# Patient Record
Sex: Female | Born: 1975 | Race: Black or African American | Hispanic: No | Marital: Married | Smoking: Current every day smoker
Health system: Southern US, Community
[De-identification: ages and names within clinical notes are randomized; demographics above are authoritative.]

## PROBLEM LIST (undated history)

## (undated) DIAGNOSIS — I219 Acute myocardial infarction, unspecified: Secondary | ICD-10-CM

## (undated) DIAGNOSIS — J3800 Paralysis of vocal cords and larynx, unspecified: Secondary | ICD-10-CM

## (undated) DIAGNOSIS — J45909 Unspecified asthma, uncomplicated: Secondary | ICD-10-CM

## (undated) DIAGNOSIS — K311 Adult hypertrophic pyloric stenosis: Secondary | ICD-10-CM

## (undated) DIAGNOSIS — I2510 Atherosclerotic heart disease of native coronary artery without angina pectoris: Secondary | ICD-10-CM

## (undated) DIAGNOSIS — I10 Essential (primary) hypertension: Secondary | ICD-10-CM

## (undated) HISTORY — PX: OTHER SURGICAL HISTORY: SHX169

## (undated) HISTORY — PX: APPENDECTOMY: SHX54

## (undated) HISTORY — PX: CHOLECYSTECTOMY: SHX55

## (undated) HISTORY — PX: MEDIAL PARTIAL KNEE REPLACEMENT: SHX5965

---
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
## 2016-05-03 ENCOUNTER — Emergency Department (HOSPITAL_COMMUNITY)
Admission: EM | Admit: 2016-05-03 | Discharge: 2016-05-04 | Disposition: A | Discharge status: Home / Self Care | Payer: Medicaid Other | Attending: Emergency Medicine | Admitting: Emergency Medicine

## 2016-05-03 ENCOUNTER — Encounter (HOSPITAL_COMMUNITY): Payer: Self-pay | Admitting: Emergency Medicine

## 2016-05-03 DIAGNOSIS — R51 Headache: Secondary | ICD-10-CM | POA: Insufficient documentation

## 2016-05-03 DIAGNOSIS — I10 Essential (primary) hypertension: Secondary | ICD-10-CM | POA: Insufficient documentation

## 2016-05-03 DIAGNOSIS — I252 Old myocardial infarction: Secondary | ICD-10-CM | POA: Insufficient documentation

## 2016-05-03 DIAGNOSIS — R519 Headache, unspecified: Secondary | ICD-10-CM

## 2016-05-03 HISTORY — DX: Essential (primary) hypertension: I10

## 2016-05-03 HISTORY — DX: Acute myocardial infarction, unspecified: I21.9

## 2016-05-03 MED ORDER — SODIUM CHLORIDE 0.9 % IV BOLUS (SEPSIS)
1000.0000 mL | Freq: Once | INTRAVENOUS | Status: AC
  Administered 2016-05-04: 1000 mL via INTRAVENOUS

## 2016-05-03 MED ORDER — KETOROLAC TROMETHAMINE 15 MG/ML IJ SOLN
15.0000 mg | Freq: Once | INTRAMUSCULAR | Status: AC
  Administered 2016-05-04: 15 mg via INTRAVENOUS
  Filled 2016-05-03: qty 1

## 2016-05-03 MED ORDER — PROCHLORPERAZINE EDISYLATE 5 MG/ML IJ SOLN
10.0000 mg | Freq: Once | INTRAMUSCULAR | Status: AC
Start: 2016-05-04 — End: 2016-05-04
  Administered 2016-05-04: 10 mg via INTRAVENOUS
  Filled 2016-05-03: qty 2

## 2016-05-03 MED ORDER — DIPHENHYDRAMINE HCL 50 MG/ML IJ SOLN
25.0000 mg | Freq: Once | INTRAMUSCULAR | Status: AC
  Administered 2016-05-04: 25 mg via INTRAVENOUS
  Filled 2016-05-03: qty 1

## 2016-05-03 NOTE — ED Triage Notes (Addendum)
Pt reports headache with left eye pain since 1000 today. Pt reports N/V, but denies diarrhea, light sensitivity, and abdominal pain.  Hx migraines.

## 2016-05-03 NOTE — ED Provider Notes (Signed)
[facility]-EMERGENCY DEPT Provider Note   CSN: [id number] Arrival date & time: 05/03/16  2004  By signing my name below, I, Julia Ingram, attest that this documentation has been prepared under the direction and in the presence of Raeford Razor, MD. Electronically Signed: Rosario Ingram, ED Scribe. 05/03/16. 11:47 PM.  History   Chief Complaint Chief Complaint  Patient presents with  . Migraine   The history is provided by the patient. No language interpreter was used.   HPI Comments: Julia Ingram is a 40 y.o. female with a PMHx of HTN and MI, who presents to the Emergency Department complaining of diffuse HA. She describes her pain as pressure-like and squeezing. She states that she woke up with her HA this morning, and it has gradually worsened throughout the day. Pt reports associated nausea and vomiting secondary to her HA. Pt took an Excedrin with minimal relief of her HA prior to coming into the ED. Pt has a hx of Migraines, but notes that her pain today feels different than her previous HAs. She notes that she has a hx of HTN as well and reports that upon checking her BP it was elevated. Denies fever, visual changes, neck pain, diarrhea, photophobia, numbness, tingling, abdominal pain, or any other associated symptoms.   Past Medical History:  Diagnosis Date  . Hypertension   . MI (myocardial infarction) (HCC)    There are no active problems to display for this patient.  Past Surgical History:  Procedure Laterality Date  . APPENDECTOMY    . CHOLECYSTECTOMY     OB History    No data available     Home Medications    Prior to Admission medications   Not on File   Family History No family history on file.  Social History Social History  Substance Use Topics  . Smoking status: Not on file  . Smokeless tobacco: Not on file  . Alcohol use Not on file   Allergies   Magnesium sulfate; Contrast media [iodinated diagnostic agents]; and Demerol  [meperidine]  Review of Systems Review of Systems  Constitutional: Negative for fever.  Eyes: Negative for photophobia and visual disturbance.  Gastrointestinal: Negative for abdominal pain and diarrhea.  Musculoskeletal: Negative for neck pain.  Neurological: Positive for headaches. Negative for numbness.  All other systems reviewed and are negative.  Physical Exam Updated Vital Signs BP (!) 165/104 (BP Location: Left Arm)   Pulse 78   Temp 98.7 F (37.1 C) (Oral)   Resp 20   Ht 5\' 9"  (1.753 m)   SpO2 99%   Physical Exam  Constitutional: She is oriented to person, place, and time. She appears well-developed and well-nourished. No distress.  HENT:  Head: Normocephalic and atraumatic.  Mouth/Throat: Oropharynx is clear and moist. No oropharyngeal exudate.  No nuchal rigidity.   Eyes: Conjunctivae and EOM are normal. Pupils are equal, round, and reactive to light. Right eye exhibits no discharge. Left eye exhibits no discharge. No scleral icterus.  Neck: Normal range of motion. Neck supple. No JVD present. No thyromegaly present.  Cardiovascular: Normal rate, regular rhythm, normal heart sounds and intact distal pulses.  Exam reveals no gallop and no friction rub.   No murmur heard. Pulmonary/Chest: Effort normal and breath sounds normal. No respiratory distress. She has no wheezes. She has no rales.  Abdominal: Soft. Bowel sounds are normal. She exhibits no distension and no mass. There is no tenderness.  Musculoskeletal: Normal range of motion. She exhibits no edema or  tenderness.  Lymphadenopathy:    She has no cervical adenopathy.  Neurological: She is alert and oriented to person, place, and time. She has normal reflexes. She displays normal reflexes. No cranial nerve deficit. She exhibits normal muscle tone. Coordination normal.  Skin: Skin is warm and dry. No rash noted. No erythema.  Psychiatric: She has a normal mood and affect. Her behavior is normal.  Nursing note and  vitals reviewed.  ED Treatments / Results  DIAGNOSTIC STUDIES: Oxygen Saturation is 99% on RA, normal by my interpretation.   COORDINATION OF CARE: 11:40 PM-Discussed next steps with pt. Pt verbalized understanding and is agreeable with the plan.   Labs (all labs ordered are listed, but only abnormal results are displayed) Labs Reviewed - No data to display  EKG  EKG Interpretation None       Radiology No results found.  Procedures Procedures (including critical care time)  Medications Ordered in ED Medications - No data to display  Initial Impression / Assessment and Plan / ED Course  I have reviewed the triage vital signs and the nursing notes.  Pertinent labs & imaging results that were available during my care of the patient were reviewed by me and considered in my medical decision making (see chart for details).  Clinical Course   40yF with headache. Suspect primary HA. Consider emergent secondary causes such as bleed, infectious or mass but doubt. There is no history of trauma. Pt has a nonfocal neurological exam. Afebrile and neck supple. No use of blood thinning medication. Consider ocular etiology such as acute angle closure glaucoma but doubt. Pt denies acute change in visual acuity and eye exam unremarkable. Doubt temporal arteritis given age, no temporal tenderness and temporal artery pulsations palpable. Doubt CO poisoning. No contacts with similar symptoms. Doubt venous thrombosis. Doubt carotid or vertebral arteries dissection. Symptoms improved with meds. Feel that can be safely discharged, but strict return precautions discussed. Outpt fu.   Final Clinical Impressions(s) / ED Diagnoses   Final diagnoses:  Nonintractable headache, unspecified chronicity pattern, unspecified headache type    New Prescriptions New Prescriptions   No medications on file   I personally preformed the services scribed in my presence. The recorded information has been  reviewed is accurate. Raeford RazorStephen Avriel Kandel, MD.     Raeford RazorStephen Thoams Siefert, MD 05/08/16 1024

## 2016-05-04 NOTE — ED Notes (Signed)
Pt reports headache that started 05/03/16. Prior hx of migranes and last migrane 3months.

## 2016-05-17 ENCOUNTER — Ambulatory Visit: Payer: Self-pay | Admitting: Internal Medicine

## 2016-07-11 ENCOUNTER — Encounter (HOSPITAL_COMMUNITY): Payer: Self-pay | Admitting: Emergency Medicine

## 2016-07-11 ENCOUNTER — Ambulatory Visit (HOSPITAL_COMMUNITY)
Admission: EM | Admit: 2016-07-11 | Discharge: 2016-07-11 | Disposition: A | Discharge status: Home / Self Care | Payer: Medicaid Other | Attending: Emergency Medicine | Admitting: Emergency Medicine

## 2016-07-11 DIAGNOSIS — B309 Viral conjunctivitis, unspecified: Secondary | ICD-10-CM

## 2016-07-11 DIAGNOSIS — B353 Tinea pedis: Secondary | ICD-10-CM | POA: Diagnosis not present

## 2016-07-11 MED ORDER — KETOCONAZOLE 2 % EX CREA: 1.0000 "application " | TOPICAL_CREAM | Freq: Every day | CUTANEOUS | 0 refills | Status: DC

## 2016-07-11 MED ORDER — POLYMYXIN B-TRIMETHOPRIM 10000-0.1 UNIT/ML-% OP SOLN: 1.0000 [drp] | OPHTHALMIC | 0 refills | Status: DC

## 2016-07-11 NOTE — ED Provider Notes (Signed)
CSN: [id number]     Arrival date & time 07/11/16  1652 History   First MD Initiated Contact with Patient 07/11/16 1739     Chief Complaint  Patient presents with  . Toe Pain  . Eye Problem   (Consider location/radiation/quality/duration/timing/severity/associated sxs/prior Treatment) Patient c/o pain left great toe and left eye.   The history is provided by the patient.  Toe Pain  This is a new problem. The current episode started 12 to 24 hours ago. The problem occurs constantly. The problem has not changed since onset.Nothing aggravates the symptoms. Nothing relieves the symptoms. She has tried nothing for the symptoms.  Eye Problem  Associated symptoms: redness     Past Medical History:  Diagnosis Date  . Hypertension   . MI (myocardial infarction)    Past Surgical History:  Procedure Laterality Date  . APPENDECTOMY    . CHOLECYSTECTOMY     History reviewed. No pertinent family history. Social History  Substance Use Topics  . Smoking status: Not on file  . Smokeless tobacco: Not on file  . Alcohol use Not on file   OB History    No data available     Review of Systems  Constitutional: Negative.   HENT: Negative.   Eyes: Positive for redness.  Respiratory: Negative.   Cardiovascular: Negative.   Endocrine: Negative.   Genitourinary: Negative.   Musculoskeletal: Negative.   Skin: Positive for rash.  Allergic/Immunologic: Negative.   Neurological: Negative.   Hematological: Negative.   Psychiatric/Behavioral: Negative.     Allergies  Magnesium sulfate; Contrast media [iodinated diagnostic agents]; and Demerol [meperidine]  Home Medications   Prior to Admission medications   Medication Sig Start Date End Date Taking? Authorizing Provider  aspirin EC 81 MG tablet Take 1 tablet by mouth daily.  03/09/16 03/09/17 Yes Historical Provider, MD  lisinopril (PRINIVIL,ZESTRIL) 10 MG tablet Take 10 mg by mouth daily.   Yes Historical Provider, MD  lovastatin  (MEVACOR) 20 MG tablet Take 20 mg by mouth daily. 05/28/14  Yes Historical Provider, MD  metoprolol succinate (TOPROL-XL) 25 MG 24 hr tablet Take 25 mg by mouth daily.   Yes Historical Provider, MD  cholecalciferol (VITAMIN D) 1000 units tablet Take 1,000 Units by mouth daily.    Historical Provider, MD  lurasidone (LATUDA) 40 MG TABS tablet Take 40 mg by mouth daily with breakfast.    Historical Provider, MD   Meds Ordered and Administered this Visit  Medications - No data to display  There were no vitals taken for this visit. No data found.   Physical Exam  Constitutional: She appears well-developed and well-nourished.  HENT:  Head: Normocephalic and atraumatic.  Eyes: EOM are normal. Pupils are equal, round, and reactive to light.  Left conjunctiva injected  Neck: Normal range of motion. Neck supple.  Cardiovascular: Normal rate, regular rhythm and normal heart sounds.   Pulmonary/Chest: Effort normal and breath sounds normal.  Abdominal: Soft. Bowel sounds are normal.  Skin: Rash noted.  Left foot with tinea pedis  Nursing note and vitals reviewed.   Urgent Care Course   Clinical Course     Procedures (including critical care time)  Labs Review Labs Reviewed - No data to display  Imaging Review No results found.   Visual Acuity Review  Right Eye Distance:   Left Eye Distance:   Bilateral Distance:    Right Eye Near:   Left Eye Near:    Bilateral Near:  MDM  Tinea pedis left foot - Ketoconazole cream 2% apply bid to affected area #15 grams  Conjunctivitis Left eye - Polytrim eye gtt's 1 gtt OS q 4 hours #10 ml    Deatra CanterWilliam J Doniqua Saxby, FNP 07/11/16 1837

## 2016-07-11 NOTE — ED Triage Notes (Signed)
[facility] a [facility] of pain to her left great toe and her left eye.

## 2016-07-27 ENCOUNTER — Emergency Department (HOSPITAL_COMMUNITY)
Admission: EM | Admit: 2016-07-27 | Discharge: 2016-07-27 | Disposition: A | Discharge status: Home / Self Care | Payer: Medicaid Other | Attending: Emergency Medicine | Admitting: Emergency Medicine

## 2016-07-27 ENCOUNTER — Encounter (HOSPITAL_COMMUNITY): Payer: Self-pay | Admitting: Emergency Medicine

## 2016-07-27 DIAGNOSIS — I252 Old myocardial infarction: Secondary | ICD-10-CM | POA: Diagnosis not present

## 2016-07-27 DIAGNOSIS — Z7982 Long term (current) use of aspirin: Secondary | ICD-10-CM | POA: Diagnosis not present

## 2016-07-27 DIAGNOSIS — Z79899 Other long term (current) drug therapy: Secondary | ICD-10-CM | POA: Insufficient documentation

## 2016-07-27 DIAGNOSIS — F17200 Nicotine dependence, unspecified, uncomplicated: Secondary | ICD-10-CM | POA: Insufficient documentation

## 2016-07-27 DIAGNOSIS — G43901 Migraine, unspecified, not intractable, with status migrainosus: Secondary | ICD-10-CM | POA: Diagnosis not present

## 2016-07-27 DIAGNOSIS — R51 Headache: Secondary | ICD-10-CM | POA: Diagnosis present

## 2016-07-27 DIAGNOSIS — I10 Essential (primary) hypertension: Secondary | ICD-10-CM | POA: Insufficient documentation

## 2016-07-27 MED ORDER — PROCHLORPERAZINE EDISYLATE 5 MG/ML IJ SOLN
10.0000 mg | Freq: Once | INTRAMUSCULAR | Status: AC
  Administered 2016-07-27: 10 mg via INTRAVENOUS
  Filled 2016-07-27: qty 2

## 2016-07-27 MED ORDER — SODIUM CHLORIDE 0.9 % IV SOLN
INTRAVENOUS | Status: DC
  Administered 2016-07-27: 08:00:00 via INTRAVENOUS

## 2016-07-27 MED ORDER — KETOROLAC TROMETHAMINE 15 MG/ML IJ SOLN
15.0000 mg | Freq: Once | INTRAMUSCULAR | Status: AC
  Administered 2016-07-27: 15 mg via INTRAVENOUS
  Filled 2016-07-27: qty 1

## 2016-07-27 MED ORDER — DIPHENHYDRAMINE HCL 50 MG/ML IJ SOLN
12.5000 mg | Freq: Once | INTRAMUSCULAR | Status: AC
Start: 2016-07-27 — End: 2016-07-27
  Administered 2016-07-27: 12.5 mg via INTRAVENOUS
  Filled 2016-07-27: qty 1

## 2016-07-27 NOTE — ED Notes (Signed)
Bed: WA18 Expected date:  Expected time:  Means of arrival:  Comments: 

## 2016-07-27 NOTE — ED Provider Notes (Signed)
[facility]-EMERGENCY DEPT Provider Note   CSN: [id number] Arrival date & time: 07/27/16  [id number]     History   Chief Complaint Chief Complaint  Patient presents with  . Headache  . Hypertension    HPI Julia Ingram is a 40 y.o. female.  HPI Pt woke up this am with a headache.  Her vision was also blurred. She was concerned it could be related to her BP so she took it and it was 170/120.  She called the nurse and was told to come to the ED.  The headache involves the front and back of the the head.  Throbbing in nature.  Photophobia but no vomiting or fever. No speech trouble or weakness. She does feel nauseous.  She took her regular blood pressure medication at midnight which is usual for her.   She tried 3 excedrin migraines but it is not helping.  No falls or injuries.   Past Medical History:  Diagnosis Date  . Hypertension   . MI (myocardial infarction)     There are no active problems to display for this patient.   Past Surgical History:  Procedure Laterality Date  . APPENDECTOMY    . CHOLECYSTECTOMY      OB History    No data available       Home Medications    Prior to Admission medications   Medication Sig Start Date End Date Taking? Authorizing Provider  aspirin EC 81 MG tablet Take 1 tablet by mouth daily.  03/09/16 03/09/17 Yes Historical Provider, MD  cholecalciferol (VITAMIN D) 1000 units tablet Take 1,000 Units by mouth daily.   Yes Historical Provider, MD  lisinopril (PRINIVIL,ZESTRIL) 10 MG tablet Take 10 mg by mouth daily.   Yes Historical Provider, MD  lovastatin (MEVACOR) 20 MG tablet Take 20 mg by mouth daily. 05/28/14  Yes Historical Provider, MD  ketoconazole (NIZORAL) 2 % cream Apply 1 application topically daily. 07/11/16   Deatra CanterWilliam J Oxford, FNP  metoprolol succinate (TOPROL-XL) 25 MG 24 hr tablet Take 25 mg by mouth daily.    Historical Provider, MD  trimethoprim-polymyxin b (POLYTRIM) ophthalmic solution Place 1 drop into the right eye every 4 (four)  hours. 07/11/16   Deatra CanterWilliam J Oxford, FNP    Family History History reviewed. No pertinent family history.  Social History Social History  Substance Use Topics  . Smoking status: Current Every Day Smoker  . Smokeless tobacco: Never Used  . Alcohol use No     Allergies   Magnesium sulfate; Contrast media [iodinated diagnostic agents]; and Demerol [meperidine]   Review of Systems Review of Systems  All other systems reviewed and are negative.    Physical Exam Updated Vital Signs BP 139/93   Pulse 64   Temp 98.3 F (36.8 C) (Oral)   Resp 17   SpO2 98%   Physical Exam  Constitutional: She appears well-developed and well-nourished. No distress.  HENT:  Head: Normocephalic and atraumatic.  Right Ear: External ear normal.  Left Ear: External ear normal.  Eyes: Conjunctivae are normal. Right eye exhibits no discharge. Left eye exhibits no discharge. No scleral icterus.  Neck: Neck supple. No tracheal deviation present.  Cardiovascular: Normal rate, regular rhythm and intact distal pulses.   Pulmonary/Chest: Effort normal and breath sounds normal. No stridor. No respiratory distress. She has no wheezes. She has no rales.  Abdominal: Soft. Bowel sounds are normal. She exhibits no distension. There is no tenderness. There is no rebound and no guarding.  Musculoskeletal: She  exhibits no edema or tenderness.  Neurological: She is alert. She has normal strength. No cranial nerve deficit (no facial droop, extraocular movements intact, no slurred speech) or sensory deficit. She exhibits normal muscle tone. She displays no seizure activity. Coordination normal.  Skin: Skin is warm and dry. No rash noted.  Psychiatric: She has a normal mood and affect.  Nursing note and vitals reviewed.    ED Treatments / Results    Procedures Procedures (including critical care time)  Medications Ordered in ED Medications  0.9 %  sodium chloride infusion ( Intravenous New Bag/Given 07/27/16  0814)  prochlorperazine (COMPAZINE) injection 10 mg (10 mg Intravenous Given 07/27/16 0815)  diphenhydrAMINE (BENADRYL) injection 12.5 mg (12.5 mg Intravenous Given 07/27/16 0814)  ketorolac (TORADOL) 15 MG/ML injection 15 mg (15 mg Intravenous Given 07/27/16 0816)     Initial Impression / Assessment and Plan / ED Course  I have reviewed the triage vital signs and the nursing notes.  Pertinent labs & imaging results that were available during my care of the patient were reviewed by me and considered in my medical decision making (see chart for details).  Clinical Course    Pt was given medications for a migraine.   Her symptoms have improved.  Visual sx have resolved.  BP decreased.  I suspect her symptoms were related to a migraine headache.  Doubt stroke, TIA, HTN emergency or other emergency conditions.  At this time there does not appear to be any evidence of an acute emergency medical condition and the patient appears stable for discharge with appropriate outpatient follow up.   Final Clinical Impressions(s) / ED Diagnoses   Final diagnoses:  Migraine with status migrainosus, not intractable, unspecified migraine type    New Prescriptions New Prescriptions   No medications on file     Linwood DibblesJon Wandalene Abrams, MD 07/27/16 1235

## 2016-07-27 NOTE — Discharge Instructions (Signed)
Continue your current medications follow-up with your primary care doctor, return as needed for worsening symptoms

## 2016-07-27 NOTE — ED Triage Notes (Signed)
Pt from home came to ED r/t headache and HTN with blurred vision. Negative NIHH scale no deficits noted per EMS

## 2016-09-18 ENCOUNTER — Ambulatory Visit (INDEPENDENT_AMBULATORY_CARE_PROVIDER_SITE_OTHER)
Admission: RE | Admit: 2016-09-18 | Discharge: 2016-09-18 | Disposition: A | Discharge status: Home / Self Care | Payer: Medicaid Other | Source: Ambulatory Visit | Attending: Internal Medicine | Admitting: Internal Medicine

## 2016-09-18 ENCOUNTER — Encounter: Payer: Self-pay | Admitting: Internal Medicine

## 2016-09-18 ENCOUNTER — Ambulatory Visit (INDEPENDENT_AMBULATORY_CARE_PROVIDER_SITE_OTHER): Payer: Medicaid Other | Admitting: Internal Medicine

## 2016-09-18 VITALS — BP 134/82 | HR 74 | Ht 69.0 in | Wt 279.0 lb

## 2016-09-18 DIAGNOSIS — I10 Essential (primary) hypertension: Secondary | ICD-10-CM | POA: Diagnosis not present

## 2016-09-18 DIAGNOSIS — R05 Cough: Secondary | ICD-10-CM | POA: Diagnosis not present

## 2016-09-18 DIAGNOSIS — F17210 Nicotine dependence, cigarettes, uncomplicated: Secondary | ICD-10-CM | POA: Insufficient documentation

## 2016-09-18 DIAGNOSIS — Z23 Encounter for immunization: Secondary | ICD-10-CM

## 2016-09-18 DIAGNOSIS — I160 Hypertensive urgency: Secondary | ICD-10-CM | POA: Insufficient documentation

## 2016-09-18 DIAGNOSIS — R058 Other specified cough: Secondary | ICD-10-CM | POA: Insufficient documentation

## 2016-09-18 MED ORDER — PANTOPRAZOLE SODIUM 40 MG PO TBEC: 40.0000 mg | DELAYED_RELEASE_TABLET | Freq: Every day | ORAL | 2 refills | Status: DC

## 2016-09-18 MED ORDER — VALSARTAN 160 MG PO TABS: 160.0000 mg | ORAL_TABLET | Freq: Every day | ORAL | 11 refills | Status: DC

## 2016-09-18 MED ORDER — FAMOTIDINE 20 MG PO TABS: ORAL_TABLET | ORAL | 11 refills | Status: DC

## 2016-09-18 NOTE — Assessment & Plan Note (Addendum)
Body mass index is 41.2      No results found for: TSH   Contributing to gerd risk/ doe/reviewed the need and the process to achieve and maintain neg calorie balance > defer f/u primary care including intermittently monitoring thyroid status

## 2016-09-18 NOTE — Assessment & Plan Note (Signed)
In the best review of chronic cough to date ( NEJM 2016 375 1544-1551) ,  ACEi are now felt to cause cough in up to  20% of pts which is a 4 fold increase from previous reports and does not include the variety of non-specific complaints we see in pulmonary clinic in pts on ACEi but previously attributed to another dx like  Copd/asthma and  include PNDS, throat and chest congestion, "bronchitis", unexplained dyspnea and noct "strangling" sensations, and hoarseness, but also  atypical /refractory GERD symptoms like dysphagia and "bad heartburn"   The only way I know  to prove this is not an "ACEi Case" is a trial off ACEi x a minimum of 6 weeks then regroup.   Try diovan 160 mg daily  

## 2016-09-18 NOTE — Patient Instructions (Addendum)
Pantoprazole (protonix) 40 mg   Take  30-60 min before first meal of the day and Pepcid (famotidine)  20 mg one @  bedtime until return to office - this is the best way to tell whether stomach acid is contributing to your problem.    Stop lisinopril and start diovan (valsartan) 160 mg one daily until return   GERD (REFLUX)  is an extremely common cause of respiratory symptoms just like yours , many times with no obvious heartburn at all.    It can be treated with medication, but also with lifestyle changes including elevation of the head of your bed (ideally with 6 inch  bed blocks),  Smoking cessation, avoidance of late meals, excessive alcohol, and avoid fatty foods, chocolate, peppermint, colas, red wine, and acidic juices such as orange juice.  NO MINT OR MENTHOL PRODUCTS SO NO COUGH DROPS   USE SUGARLESS CANDY INSTEAD (Jolley ranchers or Stover's or Life Savers) or even ice chips will also do - the key is to swallow to prevent all throat clearing. NO OIL BASED VITAMINS - use powdered substitutes.   Please schedule a follow up office visit in 4 weeks, sooner if needed with pfts - you must bring all inhalers with you and all medications with you to the office

## 2016-09-18 NOTE — Assessment & Plan Note (Signed)
Upper airway cough syndrome (previously labeled PNDS) , is  so named because it's frequently impossible to sort out how much is  CR/sinusitis with freq throat clearing (which can be related to primary GERD)   vs  causing  secondary (" extra esophageal")  GERD from wide swings in gastric pressure that occur with throat clearing, often  promoting self use of mint and menthol lozenges that reduce the lower esophageal sphincter tone and exacerbate the problem further in a cyclical fashion.   These are the same pts (now being labeled as having "irritable larynx syndrome" by some cough centers) who not infrequently have a history of having failed to tolerate ace inhibitors,  dry powder inhalers or biphosphonates or report having atypical/extraesophageal reflux symptoms that don't respond to standard doses of PPI  and are easily confused as having aecopd or asthma flares by even experienced allergists/ pulmonologists (myself included).   Also, Of the three most common causes of chronic cough, only one (GERD)  can actually cause the other two (asthma and post nasal drip syndrome)  and perpetuate the cylce of cough inducing airway trauma, inflammation, heightened sensitivity to reflux which is prompted by the cough itself via a cyclical mechanism.    This may partially respond to steroids and look like asthma and post nasal drainage but never erradicated completely unless the cough and the secondary reflux are eliminated, preferably both at the same time.  While not intuitively obvious, many patients with chronic low grade reflux do not cough until there is a secondary insult that disturbs the protective epithelial barrier and exposes sensitive nerve endings.  This can be viral or direct physical injury such as with an endotracheal tube as well may have been the case here   The point is that once this occurs, it is difficult to eliminate using anything but a maximally effective acid suppression regimen at least in  the short run, accompanied by an appropriate diet to address non acid GERD.   rec max gerd rx and off acei x 4 weeks then return for f/u pfts - in meantime work on smoking cessation.  Total time devoted to counseling  > 50 % of initial 60 min office visit:  review case with pt/ discussion of options/alternatives/ personally creating written customized instructions  in presence of pt  then going over those specific  Instructions directly with the pt including how to use all of the meds but in particular covering each new medication in detail and the difference between the maintenance= "automatic" meds and the prns using an action plan format for the latter (If this problem/symptom => do that organization reading Left to right).  Please see AVS from this visit for a full list of these instructions which I personally wrote for this pt and  are unique to this visit.

## 2016-09-18 NOTE — Progress Notes (Signed)
Subjective:     Patient ID: Julia Ingram, female   DOB: 08-25-1975,     MRN: [id number]  HPI  [age] yobf active smoker referred to pulmonary clinic 09/18/2016 by Dr   Asencion Partridgeamille Andy for sob/cough.   09/18/2016 1st [facility] office visit/ Devaris Quirk   Chief Complaint  Patient presents with  . Pulmonary Consult    Referred by Dr. Angelina Pihamile Andy.  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  She states that she gets winded sometimes just walkin short distances or just talking. She also c/o dysphagia and coughing. Her cough is non prod.   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 No unusual exp hx or h/o childhood pna/ asthma or knowledge of premature birth.  Sleeping ok without nocturnal  or early am exacerbation  of respiratory  c/o's or need for noct saba. Also denies any obvious fluctuation of symptoms with weather or environmental changes or other aggravating or alleviating factors except as outlined above   Current Medications, Allergies, Complete Past Medical History, Past Surgical History, Family History, and Social History were reviewed in [facility] electronic medical record.  ROS  The following are not active complaints unless bolded sore throat, dysphagia, dental problems, itching, sneezing,  nasal congestion or excess/ purulent secretions, ear ache,   fever, chills, sweats, unintended wt loss, classically pleuritic or exertional cp,  orthopnea pnd or leg swelling, presyncope, palpitations, abdominal pain, anorexia, nausea, vomiting, diarrhea  or change in bowel or bladder habits, change in stools or  urine, dysuria,hematuria,  rash, arthralgias, visual complaints, headache, numbness, weakness or ataxia or problems with walking or coordination,  change in mood/affect or memory.         Review of Systems     Objective:   Physical Exam    amb bf  Very soft spoken/ classic pseudowheeze   Wt Readings from Last 3 Encounters:  09/18/16 279 lb (126.6 kg)    Vital signs reviewed  - Note on arrival 02 sats  100% on RA     HEENT: nl dentition, turbinates bilaterally, and oropharynx. Nl external ear canals without cough reflex   NECK :  without JVD/Nodes/TM/ nl carotid upstrokes bilaterally   LUNGS: no acc muscle use,  Nl contour chest which is clear to A and P bilaterally without cough on insp or exp maneuvers - only transmitted upper airway noises heard over chest but they are quite prominent     CV:  RRR  no s3 or murmur or increase in P2, and no edema   ABD:  soft and nontender with nl inspiratory excursion in the supine position. No bruits or organomegaly appreciated, bowel sounds nl  MS:  Nl gait/ ext warm without deformities, calf tenderness, cyanosis or clubbing No obvious joint restrictions   SKIN: warm and dry without lesions    NEURO:  alert, approp, nl sensorium with  no motor or cerebellar deficits apparent.     CXR PA and Lateral:   09/18/2016 :    I personally reviewed  images and agree with radiology impression as follows:    cardiomegaly s chf or acute findings   Assessment:

## 2016-09-18 NOTE — Assessment & Plan Note (Signed)

## 2016-09-19 ENCOUNTER — Telehealth: Payer: Self-pay | Admitting: Internal Medicine

## 2016-09-19 MED ORDER — IRBESARTAN 150 MG PO TABS: 150.0000 mg | ORAL_TABLET | Freq: Every day | ORAL | 2 refills | Status: DC

## 2016-09-19 NOTE — Telephone Encounter (Signed)
lmtcb X1 for pt to make aware of rx change.  Avapro sent to pharmacy.

## 2016-09-19 NOTE — Progress Notes (Signed)
LMTCB

## 2016-09-19 NOTE — Progress Notes (Signed)
Spoke with pt and notified of results per Dr. Wert. Pt verbalized understanding and denied any questions. 

## 2016-09-19 NOTE — Telephone Encounter (Signed)
Received a fax from the pharmacy stating that the pts insurance does not cover the Valsartan 160 mg.  MW would you like to change this med or initiate the PA?  thanks

## 2016-09-19 NOTE — Telephone Encounter (Signed)
Try avapro 150 mg daily and if not this then losartan 100 mg

## 2016-09-21 MED ORDER — LOSARTAN POTASSIUM 100 MG PO TABS
100.0000 mg | ORAL_TABLET | Freq: Every day | ORAL | 6 refills | Status: DC
Start: 2016-09-21 — End: 2018-03-01

## 2016-09-21 NOTE — Telephone Encounter (Signed)
Received paper from pharmacy that the avapro would need PA done. Sent in losartan 100 mg daily to the pharmacy.

## 2016-10-16 ENCOUNTER — Ambulatory Visit (INDEPENDENT_AMBULATORY_CARE_PROVIDER_SITE_OTHER): Payer: Medicaid Other | Admitting: Internal Medicine

## 2016-10-16 ENCOUNTER — Encounter: Payer: Self-pay | Admitting: Internal Medicine

## 2016-10-16 VITALS — BP 128/84 | HR 87 | Ht 69.0 in | Wt 275.0 lb

## 2016-10-16 DIAGNOSIS — R0609 Other forms of dyspnea: Secondary | ICD-10-CM

## 2016-10-16 DIAGNOSIS — R911 Solitary pulmonary nodule: Secondary | ICD-10-CM | POA: Diagnosis not present

## 2016-10-16 DIAGNOSIS — F17210 Nicotine dependence, cigarettes, uncomplicated: Secondary | ICD-10-CM

## 2016-10-16 DIAGNOSIS — R058 Other specified cough: Secondary | ICD-10-CM

## 2016-10-16 DIAGNOSIS — I10 Essential (primary) hypertension: Secondary | ICD-10-CM

## 2016-10-16 DIAGNOSIS — R05 Cough: Secondary | ICD-10-CM | POA: Diagnosis not present

## 2016-10-16 NOTE — Assessment & Plan Note (Signed)
D/c acei and rx gerd 09/18/2016 >> did not do 10/16/2016  - Spirometry 10/16/2016  Purely restrictive   rec follow original instructions > f/u prn

## 2016-10-16 NOTE — Assessment & Plan Note (Signed)

## 2016-10-16 NOTE — Assessment & Plan Note (Signed)
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 gerd > rec gerd rx and f/u in 6 week with full pfts if not better  I had an extended discussion with the patient reviewing all relevant studies completed to date and  lasting 15 to 20 minutes of a 25 minute visit    Each maintenance medication was reviewed in detail including most importantly the difference between maintenance and prns and under what circumstances the prns are to be triggered using an action plan format that is not reflected in the computer generated alphabetically organized AVS.    Please see AVS for specific instructions unique to this visit that I personally wrote and verbalized to the the pt in detail and then reviewed with pt  by my nurse highlighting any  changes in therapy recommended at today's visit to their plan of care.

## 2016-10-16 NOTE — Assessment & Plan Note (Signed)
See CT chest 10/01/16 x 4 mm/ active smoker > placed in reminder file for 10/01/17   CT results reviewed with pt >>> Too small for PET or bx, not suspicious enough for excisional bx > really only option for now is follow the Fleischner society guidelines as rec by radiology.   Discussed in detail all the  indications, usual  risks and alternatives  relative to the benefits with patient who agrees to proceed with conservative f/u as outlined  = f/u study 09/2017 at [facility] to compare to baseline, in meatime stop smoking

## 2016-10-16 NOTE — Patient Instructions (Signed)
The key is to stop smoking completely before smoking completely stops you - it's not too late  If cough or breathing problems persist, strongly rec Pantoprazole (protonix) 40 mg   Take  30-60 min before first meal of the day and Pepcid (famotidine)  20 mg one @  bedtime until return to office - this is the best way to tell whether stomach acid is contributing to your problem.    If not better to your satisfaction in 6 weeks, return with all active medications in hand for full pfts [facility]  We will be in touch re scheduling a follow up CT chest 09/2017

## 2016-10-16 NOTE — Progress Notes (Signed)
Subjective:     Patient ID: Julia Ingram, female   DOB: [birth year],     MRN: [id number]  Brief patient profile:  67 yobf active smoker referred to pulmonary clinic 09/18/2016 by Dr   Julia Ingram for sob/cough.    History of Present Illness  09/18/2016 1st [facility] office visit/ Julia Ingram   Chief Complaint  Patient presents with  . Pulmonary Consult    Referred by Dr. Angelina Ingram.  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  She states that she gets winded sometimes just walkin short distances or just talking. She also c/o dysphagia and coughing. Her cough is non prod.   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   Stop lisinopril and start diovan (valsartan) 160 mg one daily until return  GERD (REFLUX)   Please schedule a follow up office visit in 4 weeks, sooner if needed with pfts - you must bring all inhalers with you and all medications with you to the office     10/16/2016  f/u ov/Julia Ingram re: unexplained sob/ f/u SPN x 4 mm 10/01/16  Chief Complaint  Patient presents with  . Follow-up    Pt states here to discuss pulmonary nodule.    Doe x 50 ft unchanged but has not yet followed any of the instructions"could not afford"  No obvious day to day or daytime variability or assoc excess/ purulent sputum or mucus plugs or hemoptysis or cp or chest tightness, subjective wheeze or overt sinus or hb symptoms. No unusual exp hx or h/o childhood pna/ asthma or knowledge of premature birth.  Sleeping ok without nocturnal  or early am exacerbation  of  respiratory  c/o's or need for noct saba. Also denies any obvious fluctuation of symptoms with weather or environmental changes or other aggravating or alleviating factors except as outlined above   Current Medications, Allergies, Complete Past Medical History, Past Surgical History, Family History, and Social History were reviewed in [facility] record.  ROS  The following are not active complaints unless bolded sore throat, dysphagia, dental problems, itching, sneezing,  nasal congestion or excess/ purulent secretions, ear ache,   fever, chills, sweats, unintended wt loss, classically pleuritic or exertional cp,  orthopnea pnd or leg swelling, presyncope, palpitations, abdominal pain, anorexia, nausea, vomiting, diarrhea  or change in bowel or bladder habits, change in stools or urine, dysuria,hematuria,  rash, arthralgias, visual complaints, headache, numbness, weakness or ataxia or problems with walking or coordination,  change in mood/affect or memory.                    Objective:   Physical Exam    amb bf  Very soft spoken/    Wt Readings from Last 3 Encounters:  10/16/16 275 lb (124.7 kg)  09/18/16 279 lb (126.6 kg)    Vital signs reviewed - Note on arrival 02 sats  100% on RA - not bp 128/84  off bp meds for a week      HEENT: nl dentition, turbinates bilaterally, and oropharynx. Nl external ear canals without cough reflex   NECK :  without JVD/Nodes/TM/ nl carotid upstrokes bilaterally   LUNGS: no acc muscle use,  Nl contour chest which is clear to A and P bilaterally without cough on insp or exp maneuvers   CV:  RRR  no s3 or murmur or increase in P2, and no edema   ABD:  soft and nontender with nl inspiratory excursion in the supine position. No bruits or organomegaly appreciated, bowel sounds nl  MS:  Nl gait/ ext warm without deformities, calf tenderness, cyanosis or clubbing No obvious joint restrictions   SKIN: warm and dry without  lesions    NEURO:  alert, approp, nl sensorium with  no motor or cerebellar deficits apparent.         Assessment:

## 2016-10-16 NOTE — Assessment & Plan Note (Signed)
Adequate off all rx per pt > Follow up per Primary Care planned

## 2016-12-12 ENCOUNTER — Emergency Department (HOSPITAL_COMMUNITY): Payer: Medicaid Other

## 2016-12-12 ENCOUNTER — Emergency Department (HOSPITAL_COMMUNITY)
Admission: EM | Admit: 2016-12-12 | Discharge: 2016-12-12 | Disposition: A | Discharge status: Home / Self Care | Payer: Medicaid Other | Attending: Emergency Medicine | Admitting: Emergency Medicine

## 2016-12-12 DIAGNOSIS — I252 Old myocardial infarction: Secondary | ICD-10-CM | POA: Insufficient documentation

## 2016-12-12 DIAGNOSIS — Z7982 Long term (current) use of aspirin: Secondary | ICD-10-CM | POA: Insufficient documentation

## 2016-12-12 DIAGNOSIS — I10 Essential (primary) hypertension: Secondary | ICD-10-CM | POA: Insufficient documentation

## 2016-12-12 DIAGNOSIS — Z79899 Other long term (current) drug therapy: Secondary | ICD-10-CM | POA: Diagnosis not present

## 2016-12-12 DIAGNOSIS — M546 Pain in thoracic spine: Secondary | ICD-10-CM | POA: Insufficient documentation

## 2016-12-12 DIAGNOSIS — F17210 Nicotine dependence, cigarettes, uncomplicated: Secondary | ICD-10-CM | POA: Insufficient documentation

## 2016-12-12 MED ORDER — NAPROXEN 500 MG PO TABS: 500.0000 mg | ORAL_TABLET | Freq: Two times a day (BID) | ORAL | 0 refills | Status: DC

## 2016-12-12 MED ORDER — KETOROLAC TROMETHAMINE 30 MG/ML IJ SOLN
30.0000 mg | Freq: Once | INTRAMUSCULAR | Status: DC
  Filled 2016-12-12: qty 1

## 2016-12-12 MED ORDER — KETOROLAC TROMETHAMINE 60 MG/2ML IM SOLN
60.0000 mg | Freq: Once | INTRAMUSCULAR | Status: AC
  Administered 2016-12-12: 60 mg via INTRAMUSCULAR

## 2016-12-12 NOTE — ED Provider Notes (Signed)
[facility]-EMERGENCY DEPT Provider Note    By signing my name below, I, Earmon Phoenix, attest that this documentation has been prepared under the direction and in the presence of Pricilla Loveless, MD. Electronically Signed: Earmon Phoenix, ED Scribe. 12/12/16. 3:57 AM.   History   Chief Complaint Chief Complaint  Patient presents with  . Back Pain  . Shortness of Breath    The history is provided by the patient and medical records. No language interpreter was used.    Julia Ingram is an obese 41 y.o. female with PMHx of HTN and MI who presents to the Emergency Department complaining of stabbing left upper back pain that began about 15 hours ago. She reports associated pain with deep inspiration. She reports swelling of the RLE that is normal at baseline (since surgery in October 2016) but has worsened over the past few days. She states she was getting dressed when the pain began. She has not taken anything for pain. Any movement increases the pain. Lying still helps alleviate the pain. She denies fever, chills, cough, hemoptysis, nausea, vomiting. She denies any recent travel or h/o cancer. She reports smoking cigarettes daily. She denies birth control. She denies any known trauma, injury or fall. She reports allergies to Demerol, magnesium sulfate and MRI contrast dye.   Past Medical History:  Diagnosis Date  . Hypertension   . MI (myocardial infarction)     Patient Active Problem List   Diagnosis Date Noted  . Solitary pulmonary nodule 10/16/2016  . Dyspnea on exertion 10/16/2016  . Upper airway cough syndrome 09/18/2016  . Essential hypertension 09/18/2016  . Morbid (severe) obesity due to excess calories (HCC) 09/18/2016  . Cigarette smoker 09/18/2016    Past Surgical History:  Procedure Laterality Date  . APPENDECTOMY    . CHOLECYSTECTOMY      OB History    No data available       Home Medications    Prior to Admission medications   Medication Sig Start Date  End Date Taking? Authorizing Provider  aspirin EC 81 MG tablet Take 1 tablet by mouth daily.  03/09/16 03/09/17  [provider]  cholecalciferol (VITAMIN D) 1000 units tablet Take 1,000 Units by mouth daily.    [provider]  famotidine (PEPCID) 20 MG tablet One at bedtime Patient not taking: Reported on 10/16/2016 09/18/16   Nyoka Cowden, MD  irbesartan (AVAPRO) 150 MG tablet Take 1 tablet (150 mg total) by mouth daily. Patient not taking: Reported on 10/16/2016 09/19/16   Nyoka Cowden, MD  losartan (COZAAR) 100 MG tablet Take 1 tablet (100 mg total) by mouth daily. Patient not taking: Reported on 10/16/2016 09/21/16   Nyoka Cowden, MD  metoprolol succinate (TOPROL-XL) 25 MG 24 hr tablet Take 25 mg by mouth daily.    [provider]  pantoprazole (PROTONIX) 40 MG tablet Take 1 tablet (40 mg total) by mouth daily. Take 30-60 min before first meal of the day Patient not taking: Reported on 10/16/2016 09/18/16   Nyoka Cowden, MD  pravastatin (PRAVACHOL) 80 MG tablet Take 80 mg by mouth daily.    [provider]  trimethoprim-polymyxin b (POLYTRIM) ophthalmic solution Place 1 drop into the right eye every 4 (four) hours. Patient not taking: Reported on 10/16/2016 07/11/16   Deatra Canter, FNP  valsartan (DIOVAN) 160 MG tablet Take 1 tablet (160 mg total) by mouth daily. Patient not taking: Reported on 10/16/2016 09/18/16   Nyoka Cowden, MD  Family History Family History  Problem Relation Age of Onset  . Allergies Mother   . Asthma Mother   . Asthma Father   . Stomach cancer Maternal Grandmother     Social History Social History  Substance Use Topics  . Smoking status: Current Every Day Smoker    Packs/day: 0.50    Years: 20.00    Types: Cigarettes  . Smokeless tobacco: Never Used  . Alcohol use No     Allergies   Magnesium sulfate; Contrast media [iodinated diagnostic agents]; and Demerol [meperidine]   Review of Systems Review of  Systems  Constitutional: Negative for chills and fever.  Respiratory: Positive for shortness of breath. Negative for cough.   Cardiovascular: Positive for leg swelling.  Gastrointestinal: Negative for nausea and vomiting.  Musculoskeletal: Positive for back pain.     Physical Exam Updated Vital Signs BP (!) 168/104 (BP Location: Left Arm)   Pulse 84   Temp 97.9 F (36.6 C) (Oral)   Resp (!) 21   SpO2 97%   Physical Exam  Constitutional: She is oriented to person, place, and time. She appears well-developed and well-nourished.  HENT:  Head: Normocephalic and atraumatic.  Right Ear: External ear normal.  Left Ear: External ear normal.  Nose: Nose normal.  Eyes: Right eye exhibits no discharge. Left eye exhibits no discharge.  Cardiovascular: Normal rate, regular rhythm and normal heart sounds.   Pulmonary/Chest: Effort normal and breath sounds normal.  Abdominal: Soft. There is no tenderness.  Musculoskeletal: She exhibits tenderness.  Right calf mildly larger than left. No tenderness. Point tenderness with mild swelling to left thoracic back between scapula and midline.  Neurological: She is alert and oriented to person, place, and time.  Skin: Skin is warm and dry.  Nursing note and vitals reviewed.    ED Treatments / Results  DIAGNOSTIC STUDIES: Oxygen Saturation is 97% on RA, normal by my interpretation.    Medications - No data to display  Labs (all labs ordered are listed, but only abnormal results are displayed) Labs Reviewed - No data to display  EKG  EKG Interpretation  Date/Time:  Wednesday Dec 12 2016 00:59:53 EDT Ventricular Rate:  82 PR Interval:    QRS Duration: 112 QT Interval:  398 QTC Calculation: 465 R Axis:   -51 Text Interpretation:  Sinus rhythm Consider right atrial enlargement Borderline IVCD with LAD Inferior infarct, old Consider anterior infarct Baseline wander in lead(s) V1 V3 No old tracing to compare Confirmed by Pricilla Loveless  [phone]) on 12/12/2016 3:10:57 AM       Radiology Dg Chest 2 View  Result Date: 12/12/2016 CLINICAL DATA:  Shortness of breath for 24 hours.  Lethargic. EXAM: CHEST  2 VIEW COMPARISON:  09/18/2016 FINDINGS: Shallow inspiration with linear atelectasis in the lung bases. No focal consolidation or airspace disease. No blunting of costophrenic angles. No pneumothorax. Mediastinal contours appear intact. Borderline heart size with normal pulmonary vascularity. IMPRESSION: Shallow inspiration with linear atelectasis in the lung bases. No focal consolidation. Electronically Signed   By: Burman Nieves M.D.   On: 12/12/2016 03:33    Procedures Procedures (including critical care time)  Medications Ordered in ED Medications - No data to display   Initial Impression / Assessment and Plan / ED Course  I have reviewed the triage vital signs and the nursing notes.  Pertinent labs & imaging results that were available during my care of the patient were reviewed by me and considered in my medical decision making (  see chart for details).     Patient's pain is most likely MSK. Given onset while putting on clothes, possible strain or spasm. With point tenderness this is most likely cause. Normal strength/sensation in BLE. Highly doubt spinal process. I think dissection is unlikely. Pleuritic pain is probably because of a muscular component, but I dicussed workup for PE. Shortly after ordering labs, she wants to leave and try meds at home, already has flexeril there. While I think MSK is the diagnosis, I discussed other possible concerning diagnoses such as MI or PE. She understands workup is not complete but would like IM toradol and to f/u with her doctor. Strict return precautions given.   Final Clinical Impressions(s) / ED Diagnoses   Final diagnoses:  Acute left-sided thoracic back pain    New Prescriptions New Prescriptions   No medications on file    I personally performed the services  described in this documentation, which was scribed in my presence. The recorded information has been reviewed and is accurate.     Pricilla LovelessGoldston, Coburn Knaus, MD 12/12/16 1023

## 2016-12-12 NOTE — ED Notes (Signed)
Patient is alert and oriented x4.  She states that the pain she is having started yesterday and is in her back radiating to her chest.  Patient adds that it is more intense when she takes a deep breath and that the pain is reproducible on palpation of her back between her scapulas

## 2016-12-12 NOTE — ED Triage Notes (Signed)
Pt c/o central back pain that makes inspiration painful for the past 13 hours. Pain started when pt was getting dressed. Has hx of vocal cord paralysis and astma, states this doesn't feel like an asthma attack/

## 2016-12-12 NOTE — ED Notes (Signed)
Bed: [id number] Expected date:  Expected time:  Means of arrival:  Comments: rm 3

## 2016-12-12 NOTE — ED Notes (Signed)
Patient is alert and oriented x3.  She was given DC instructions and follow up visit instructions.  Patient gave verbal understanding. She was DC ambulatory under her own power to home.  V/S stable.  He was not showing any signs of distress on DC 

## 2017-05-07 ENCOUNTER — Ambulatory Visit: Payer: Self-pay | Admitting: Internal Medicine

## 2017-08-05 ENCOUNTER — Emergency Department (HOSPITAL_BASED_OUTPATIENT_CLINIC_OR_DEPARTMENT_OTHER): Admit: 2017-08-05 | Discharge: 2017-08-05 | Disposition: A | Discharge status: Home / Self Care | Payer: Self-pay

## 2017-08-05 ENCOUNTER — Other Ambulatory Visit: Payer: Self-pay

## 2017-08-05 ENCOUNTER — Encounter (HOSPITAL_COMMUNITY): Payer: Self-pay

## 2017-08-05 ENCOUNTER — Emergency Department (HOSPITAL_COMMUNITY)
Admission: EM | Admit: 2017-08-05 | Discharge: 2017-08-05 | Disposition: A | Discharge status: Home / Self Care | Payer: Self-pay | Attending: Emergency Medicine | Admitting: Emergency Medicine

## 2017-08-05 DIAGNOSIS — I10 Essential (primary) hypertension: Secondary | ICD-10-CM | POA: Insufficient documentation

## 2017-08-05 DIAGNOSIS — Z79899 Other long term (current) drug therapy: Secondary | ICD-10-CM | POA: Insufficient documentation

## 2017-08-05 DIAGNOSIS — Z7982 Long term (current) use of aspirin: Secondary | ICD-10-CM | POA: Insufficient documentation

## 2017-08-05 DIAGNOSIS — Z96659 Presence of unspecified artificial knee joint: Secondary | ICD-10-CM | POA: Insufficient documentation

## 2017-08-05 DIAGNOSIS — M7989 Other specified soft tissue disorders: Secondary | ICD-10-CM

## 2017-08-05 DIAGNOSIS — F17210 Nicotine dependence, cigarettes, uncomplicated: Secondary | ICD-10-CM | POA: Insufficient documentation

## 2017-08-05 DIAGNOSIS — R2241 Localized swelling, mass and lump, right lower limb: Secondary | ICD-10-CM | POA: Insufficient documentation

## 2017-08-05 HISTORY — DX: Adult hypertrophic pyloric stenosis: K31.1

## 2017-08-05 NOTE — ED Notes (Addendum)
Patient called out wanting an update. Sofia PA made aware.

## 2017-08-05 NOTE — ED Notes (Addendum)
Patient walked out AMA without signing.

## 2017-08-05 NOTE — ED Provider Notes (Signed)
[facility]-EMERGENCY DEPT Provider Note   CSN: [id number] Arrival date & time: 08/05/17  1140     History   Chief Complaint Chief Complaint  Patient presents with  . Leg Swelling    right    HPI Julia Ingram is a 41 y.o. female.  The history is provided by the patient. No language interpreter was used.    Past Medical History:  Diagnosis Date  . Hypertension   . MI (myocardial infarction) (HCC)   . Pyloric stenosis     Patient Active Problem List   Diagnosis Date Noted  . Solitary pulmonary nodule 10/16/2016  . Dyspnea on exertion 10/16/2016  . Upper airway cough syndrome 09/18/2016  . Essential hypertension 09/18/2016  . Morbid (severe) obesity due to excess calories (HCC) 09/18/2016  . Cigarette smoker 09/18/2016    Past Surgical History:  Procedure Laterality Date  . APPENDECTOMY    . CHOLECYSTECTOMY    . MEDIAL PARTIAL KNEE REPLACEMENT      OB History    No data available       Home Medications    Prior to Admission medications   Medication Sig Start Date End Date Taking? Authorizing Provider  aspirin EC 81 MG tablet Take 81 mg by mouth daily.   Yes [provider]  cholecalciferol (VITAMIN D) 1000 units tablet Take 1,000 Units by mouth daily.   Yes [provider]  metoprolol succinate (TOPROL-XL) 25 MG 24 hr tablet Take 25 mg by mouth daily.   Yes [provider]  triamterene-hydrochlorothiazide (DYAZIDE) 37.5-25 MG capsule Take 1 capsule by mouth daily.   Yes [provider]  cyclobenzaprine (FLEXERIL) 10 MG tablet Take 10 mg by mouth 3 (three) times daily as needed for muscle spasms.    [provider]  famotidine (PEPCID) 20 MG tablet One at bedtime Patient not taking: Reported on 10/16/2016 09/18/16   Nyoka CowdenWert, Michael B, MD  Ferrous Fumarate (HEMOCYTE - 106 MG FE) 324 (106 Fe) MG TABS tablet Take 1 tablet by mouth daily.    [provider]  irbesartan (AVAPRO) 150 MG  tablet Take 1 tablet (150 mg total) by mouth daily. Patient not taking: Reported on 10/16/2016 09/19/16   Nyoka CowdenWert, Michael B, MD  losartan (COZAAR) 100 MG tablet Take 1 tablet (100 mg total) by mouth daily. Patient not taking: Reported on 10/16/2016 09/21/16   Nyoka CowdenWert, Michael B, MD  Multiple Vitamin (MULTIVITAMIN WITH MINERALS) TABS tablet Take 1 tablet by mouth daily.    [provider]  naproxen (NAPROSYN) 500 MG tablet Take 1 tablet (500 mg total) by mouth 2 (two) times daily. Patient not taking: Reported on 08/05/2017 12/12/16   Pricilla LovelessGoldston, Scott, MD  pantoprazole (PROTONIX) 40 MG tablet Take 1 tablet (40 mg total) by mouth daily. Take 30-60 min before first meal of the day Patient not taking: Reported on 10/16/2016 09/18/16   Nyoka CowdenWert, Michael B, MD  pravastatin (PRAVACHOL) 80 MG tablet Take 80 mg by mouth daily.    [provider]  trimethoprim-polymyxin b (POLYTRIM) ophthalmic solution Place 1 drop into the right eye every 4 (four) hours. Patient not taking: Reported on 10/16/2016 07/11/16   Deatra Canterxford, William J, FNP  valsartan (DIOVAN) 160 MG tablet Take 1 tablet (160 mg total) by mouth daily. Patient not taking: Reported on 10/16/2016 09/18/16   Nyoka CowdenWert, Michael B, MD    Family History Family History  Problem Relation Age of Onset  . Allergies Mother   . Asthma Mother   .  Asthma Father   . Stomach cancer Maternal Grandmother     Social History Social History   Tobacco Use  . Smoking status: Current Every Day Smoker    Packs/day: 0.50    Years: 20.00    Pack years: 10.00    Types: Cigarettes  . Smokeless tobacco: Never Used  Substance Use Topics  . Alcohol use: No  . Drug use: No     Allergies   Magnesium sulfate; Contrast media [iodinated diagnostic agents]; and Demerol [meperidine]   Review of Systems Review of Systems  All other systems reviewed and are negative.    Physical Exam Updated Vital Signs BP (!) 154/93 (BP Location: Left Arm)   Pulse 72   Temp 98.2 F  (36.8 C) (Oral)   Resp 18   Ht 5' 9.5" (1.765 m)   Wt 131.5 kg (290 lb)   SpO2 100%   BMI 42.21 kg/m   Physical Exam  Constitutional: She appears well-developed and well-nourished. No distress.  HENT:  Head: Normocephalic and atraumatic.  Right Ear: External ear normal.  Left Ear: External ear normal.  Mouth/Throat: Oropharynx is clear and moist.  Eyes: Conjunctivae are normal.  Neck: Neck supple.  Cardiovascular: Normal rate and regular rhythm.  No murmur heard. Pulmonary/Chest: Effort normal and breath sounds normal. No respiratory distress.  Abdominal: There is no tenderness.  Musculoskeletal: She exhibits edema and tenderness.  Calf tender   Neurological: She is alert.  Skin: Skin is warm and dry.  Psychiatric: She has a normal mood and affect.  Nursing note and vitals reviewed.    ED Treatments / Results  Labs (all labs ordered are listed, but only abnormal results are displayed) Labs Reviewed - No data to display  EKG  EKG Interpretation None       Radiology No results found.  Procedures Procedures (including critical care time)  Medications Ordered in ED Medications - No data to display   Initial Impression / Assessment and Plan / ED Course  I have reviewed the triage vital signs and the nursing notes.  Pertinent labs & imaging results that were available during my care of the patient were reviewed by me and considered in my medical decision making (see chart for details).     Doppler study,  No dvt.  Follow up with primary care Md  Final Clinical Impressions(s) / ED Diagnoses   Final diagnoses:  Leg swelling    ED Discharge Orders    None     An After Visit Summary was printed and given to the patient.    Elson AreasSofia, [place] Frith K, [place]-C 08/05/17 1638    Mancel BaleWentz, Elliott, MD 08/05/17 [phone]

## 2017-08-05 NOTE — ED Triage Notes (Signed)
Patient c/o intermittent legs swelling x 2 months, but in the past 3-4 days has had increased swelling and pain. Patient states she has not been able to put her compression sock on due to increased swelling.

## 2017-08-05 NOTE — Progress Notes (Signed)
Right lower extremity venous duplex has been completed. Negative for DVT. Results were given to Dr. Effie ShyWentz.   08/05/17 3:12 PM Olen CordialGreg Maryanna Stuber RVT

## 2017-08-05 NOTE — Discharge Instructions (Signed)
See your Physician for recheck in 2-3 days  

## 2017-08-05 NOTE — ED Provider Notes (Signed)
[facility]-EMERGENCY DEPT Provider Note   CSN: [id number] Arrival date & time: 08/05/17  1140     History   Chief Complaint Chief Complaint  Patient presents with  . Leg Swelling    right    HPI Julia Ingram is a 41 y.o. female.  The history is provided by the patient. No language interpreter was used.  Leg Pain   This is a new problem. The problem occurs constantly. The problem has been gradually worsening. The pain is present in the right lower leg. The quality of the pain is described as aching. The pain is moderate. She has tried nothing for the symptoms. The treatment provided no relief.    Past Medical History:  Diagnosis Date  . Hypertension   . MI (myocardial infarction) (HCC)   . Pyloric stenosis     Patient Active Problem List   Diagnosis Date Noted  . Solitary pulmonary nodule 10/16/2016  . Dyspnea on exertion 10/16/2016  . Upper airway cough syndrome 09/18/2016  . Essential hypertension 09/18/2016  . Morbid (severe) obesity due to excess calories (HCC) 09/18/2016  . Cigarette smoker 09/18/2016    Past Surgical History:  Procedure Laterality Date  . APPENDECTOMY    . CHOLECYSTECTOMY    . MEDIAL PARTIAL KNEE REPLACEMENT      OB History    No data available       Home Medications    Prior to Admission medications   Medication Sig Start Date End Date Taking? Authorizing Provider  aspirin EC 81 MG tablet Take 81 mg by mouth daily.   Yes [provider]  cholecalciferol (VITAMIN D) 1000 units tablet Take 1,000 Units by mouth daily.   Yes [provider]  metoprolol succinate (TOPROL-XL) 25 MG 24 hr tablet Take 25 mg by mouth daily.   Yes [provider]  triamterene-hydrochlorothiazide (DYAZIDE) 37.5-25 MG capsule Take 1 capsule by mouth daily.   Yes [provider]  cyclobenzaprine (FLEXERIL) 10 MG tablet Take 10 mg by mouth 3 (three) times daily as needed for muscle spasms.    [provider]  famotidine (PEPCID) 20 MG tablet One at bedtime Patient not taking: Reported on 10/16/2016 09/18/16   Nyoka CowdenWert, Michael B, MD  Ferrous Fumarate (HEMOCYTE - 106 MG FE) 324 (106 Fe) MG TABS tablet Take 1 tablet by mouth daily.    [provider]  irbesartan (AVAPRO) 150 MG tablet Take 1 tablet (150 mg total) by mouth daily. Patient not taking: Reported on 10/16/2016 09/19/16   Nyoka CowdenWert, Michael B, MD  losartan (COZAAR) 100 MG tablet Take 1 tablet (100 mg total) by mouth daily. Patient not taking: Reported on 10/16/2016 09/21/16   Nyoka CowdenWert, Michael B, MD  Multiple Vitamin (MULTIVITAMIN WITH MINERALS) TABS tablet Take 1 tablet by mouth daily.    [provider]  naproxen (NAPROSYN) 500 MG tablet Take 1 tablet (500 mg total) by mouth 2 (two) times daily. Patient not taking: Reported on 08/05/2017 12/12/16   Pricilla LovelessGoldston, Scott, MD  pantoprazole (PROTONIX) 40 MG tablet Take 1 tablet (40 mg total) by mouth daily. Take 30-60 min before first meal of the day Patient not taking: Reported on 10/16/2016 09/18/16   Nyoka CowdenWert, Michael B, MD  pravastatin (PRAVACHOL) 80 MG tablet Take 80 mg by mouth daily.    [provider]  trimethoprim-polymyxin b (POLYTRIM) ophthalmic solution Place 1 drop into the right eye every 4 (four) hours. Patient not taking: Reported on 10/16/2016 07/11/16   [place],  Anselm PancoastWilliam J, FNP  valsartan (DIOVAN) 160 MG tablet Take 1 tablet (160 mg total) by mouth daily. Patient not taking: Reported on 10/16/2016 09/18/16   Nyoka CowdenWert, Michael B, MD    Family History Family History  Problem Relation Age of Onset  . Allergies Mother   . Asthma Mother   . Asthma Father   . Stomach cancer Maternal Grandmother     Social History Social History   Tobacco Use  . Smoking status: Current Every Day Smoker    Packs/day: 0.50    Years: 20.00    Pack years: 10.00    Types: Cigarettes  . Smokeless tobacco: Never Used  Substance Use Topics  . Alcohol use: No  . Drug use: No      Allergies   Magnesium sulfate; Contrast media [iodinated diagnostic agents]; and Demerol [meperidine]   Review of Systems Review of Systems  Cardiovascular: Negative for chest pain.  Musculoskeletal: Negative for back pain.  All other systems reviewed and are negative.    Physical Exam Updated Vital Signs BP (!) 154/93 (BP Location: Left Arm)   Pulse 72   Temp 98.2 F (36.8 C) (Oral)   Resp 18   Ht 5' 9.5" (1.765 m)   Wt 131.5 kg (290 lb)   SpO2 100%   BMI 42.21 kg/m   Physical Exam  Constitutional: She appears well-developed and well-nourished. No distress.  HENT:  Head: Normocephalic and atraumatic.  Eyes: Conjunctivae are normal.  Neck: Neck supple.  Cardiovascular: Normal rate and regular rhythm.  No murmur heard. Pulmonary/Chest: Effort normal and breath sounds normal. No respiratory distress.  Abdominal: There is no tenderness.  Musculoskeletal: She exhibits edema.  Swollen right lower leg,  Pain with palpation of calf   Neurological: She is alert.  Skin: Skin is warm and dry.  Psychiatric: She has a normal mood and affect.  Nursing note and vitals reviewed.    ED Treatments / Results  Labs (all labs ordered are listed, but only abnormal results are displayed) Labs Reviewed - No data to display  EKG  EKG Interpretation None       Radiology No results found.  Procedures Procedures (including critical care time)  Medications Ordered in ED Medications - No data to display   Initial Impression / Assessment and Plan / ED Course  I have reviewed the triage vital signs and the nursing notes.  Pertinent labs & imaging results that were available during my care of the patient were reviewed by me and considered in my medical decision making (see chart for details).     No dvt on vascular study  Final Clinical Impressions(s) / ED Diagnoses   Final diagnoses:  Leg swelling    ED Discharge Orders    None    An After Visit Summary  was printed and given to the patient.    Elson AreasSofia, Zlata Alcaide K, [place]-C 08/05/17 1644    Mancel BaleWentz, Elliott, MD 08/05/17 [phone]

## 2017-08-12 ENCOUNTER — Other Ambulatory Visit: Payer: Self-pay | Admitting: Internal Medicine

## 2017-08-12 DIAGNOSIS — R911 Solitary pulmonary nodule: Secondary | ICD-10-CM

## 2017-08-13 ENCOUNTER — Telehealth: Payer: Self-pay | Admitting: *Deleted

## 2017-08-13 NOTE — Telephone Encounter (Signed)
No pcp per pt's chart.  Will close encounter.

## 2017-08-13 NOTE — Telephone Encounter (Signed)
She is low but not no risk since listed as smoker > let PCP know she refused to schedule/ suggest work on smoking as a priority as can't afford care necessitated by smoking

## 2017-08-13 NOTE — Telephone Encounter (Signed)
Followup CT Chest ordered  Per Johny Drillinghan, pt refused to schedule due to not having health insurance Will forward to MW as Lorain ChildesFYI

## 2017-08-13 NOTE — Telephone Encounter (Signed)
-----   Message from Nyoka CowdenMichael B Wert, MD sent at 10/16/2016  1:52 PM EDT ----- CT s contrast ok to have [facility] do it to compare with study 10/01/16

## 2017-10-02 ENCOUNTER — Other Ambulatory Visit: Payer: Self-pay

## 2018-02-28 ENCOUNTER — Encounter (HOSPITAL_COMMUNITY): Payer: Self-pay | Admitting: Emergency Medicine

## 2018-02-28 ENCOUNTER — Other Ambulatory Visit: Payer: Self-pay

## 2018-02-28 ENCOUNTER — Emergency Department (HOSPITAL_COMMUNITY): Payer: Self-pay

## 2018-02-28 DIAGNOSIS — F17210 Nicotine dependence, cigarettes, uncomplicated: Secondary | ICD-10-CM | POA: Insufficient documentation

## 2018-02-28 DIAGNOSIS — R002 Palpitations: Secondary | ICD-10-CM | POA: Insufficient documentation

## 2018-02-28 DIAGNOSIS — I10 Essential (primary) hypertension: Secondary | ICD-10-CM | POA: Insufficient documentation

## 2018-02-28 MED ORDER — ALBUTEROL SULFATE (2.5 MG/3ML) 0.083% IN NEBU
5.0000 mg | INHALATION_SOLUTION | Freq: Once | RESPIRATORY_TRACT | Status: AC
  Administered 2018-03-01: 5 mg via RESPIRATORY_TRACT
  Filled 2018-02-28: qty 6

## 2018-02-28 NOTE — ED Triage Notes (Signed)
Patient is complaining of sob and feels like heart is skipping a beat. Patient states she can not get her blood pressure to go down.

## 2018-03-01 ENCOUNTER — Emergency Department (HOSPITAL_COMMUNITY)
Admission: EM | Admit: 2018-03-01 | Discharge: 2018-03-01 | Disposition: A | Discharge status: Home / Self Care | Payer: Self-pay | Attending: Emergency Medicine | Admitting: Emergency Medicine

## 2018-03-01 DIAGNOSIS — R002 Palpitations: Secondary | ICD-10-CM

## 2018-03-01 LAB — CBC
HEMATOCRIT: 37.1 % (ref 36.0–46.0)
Hemoglobin: 12.3 g/dL (ref 12.0–15.0)
MCH: 30 pg (ref 26.0–34.0)
MCHC: 33.2 g/dL (ref 30.0–36.0)
MCV: 90.5 fL (ref 78.0–100.0)
Platelets: 438 10*3/uL — ABNORMAL HIGH (ref 150–400)
RBC: 4.1 MIL/uL (ref 3.87–5.11)
RDW: 13.9 % (ref 11.5–15.5)
WBC: 9.7 10*3/uL (ref 4.0–10.5)

## 2018-03-01 LAB — I-STAT BETA HCG BLOOD, ED (MC, WL, AP ONLY)

## 2018-03-01 LAB — BASIC METABOLIC PANEL
Anion gap: 7 (ref 5–15)
BUN: 13 mg/dL (ref 6–20)
CHLORIDE: 107 mmol/L (ref 98–111)
CO2: 28 mmol/L (ref 22–32)
CREATININE: 0.66 mg/dL (ref 0.44–1.00)
Calcium: 8.9 mg/dL (ref 8.9–10.3)
GFR calc non Af Amer: >60 mL/min (ref >60)
Glucose, Bld: 106 mg/dL — ABNORMAL HIGH (ref 70–99)
Potassium: 3.4 mmol/L — ABNORMAL LOW (ref 3.5–5.1)
SODIUM: 142 mmol/L (ref 135–145)

## 2018-03-01 LAB — BRAIN NATRIURETIC PEPTIDE: B NATRIURETIC PEPTIDE 5: 27.2 pg/mL (ref 0.0–100.0)

## 2018-03-01 LAB — I-STAT TROPONIN, ED: Troponin i, poc: 0.01 ng/mL (ref 0.00–0.08)

## 2018-03-01 NOTE — ED Provider Notes (Signed)
[facility]-EMERGENCY DEPT Provider Note  CSN: [id number] Arrival date & time: 02/28/18 2259  Chief Complaint(s) Shortness of Breath and Chest Pain  HPI Julia Ingram is a 42 y.o. female with a history of hypertension, prior MI status post stenting who presents to the emergency department with 1 month of intermittent palpitations with associated shortness of breath.  Patient reports that these palpitations feel like there are skipped beats lasting several seconds.  She reports that they have become more frequent.  She denies any associated chest pain with the symptoms however does endorse intermittent chest pain similar to her acid reflux.  Currently denies any chest pain.  States that her shortness of breath is only associated with the palpitations.  However she does endorse dyspnea on exertion and orthopnea.  She is also endorsing lower extremity swelling.  She denies any recent fevers or infections.  No coughing or congestion.  Currently feels asymptomatic.  HPI  Past Medical History Past Medical History:  Diagnosis Date  . Hypertension   . MI (myocardial infarction) (HCC)   . Pyloric stenosis    Patient Active Problem List   Diagnosis Date Noted  . Solitary pulmonary nodule 10/16/2016  . Dyspnea on exertion 10/16/2016  . Upper airway cough syndrome 09/18/2016  . Essential hypertension 09/18/2016  . Morbid (severe) obesity due to excess calories (HCC) 09/18/2016  . Cigarette smoker 09/18/2016   Home Medication(s) Prior to Admission medications   Medication Sig Start Date End Date Taking? Authorizing Provider  aspirin-acetaminophen-caffeine (EXCEDRIN MIGRAINE) [phone] MG tablet Take 1 tablet by mouth every 6 (six) hours as needed for headache.   Yes [provider]                                                                                                                                    Past Surgical History Past Surgical History:    Procedure Laterality Date  . APPENDECTOMY    . cervix     [place]  . CHOLECYSTECTOMY    . MEDIAL PARTIAL KNEE REPLACEMENT     Family History Family History  Problem Relation Age of Onset  . Allergies Mother   . Asthma Mother   . Asthma Father   . Stomach cancer Maternal Grandmother     Social History Social History   Tobacco Use  . Smoking status: Current Every Day Smoker    Packs/day: 0.50    Years: 20.00    Pack years: 10.00    Types: Cigarettes  . Smokeless tobacco: Never Used  Substance Use Topics  . Alcohol use: No  . Drug use: No   Allergies Demerol  [meperidine hcl]; Iodinated diagnostic agents; Magnesium sulfate; and Meperidine  Review of Systems Review of Systems All other systems are reviewed and are negative for acute change except as noted in the HPI  Physical Exam Vital Signs  I have reviewed the triage vital signs BP 138/83  Pulse 82   Temp 98.2 F (36.8 C) (Oral)   Resp 20   Ht 5\' 10"  (1.778 m)   Wt 134.7 kg (297 lb)   SpO2 96%   BMI 42.62 kg/m   Physical Exam  Constitutional: She is oriented to person, place, and time. She appears well-developed and well-nourished. No distress.  HENT:  Head: Normocephalic and atraumatic.  Nose: Nose normal.  Eyes: Pupils are equal, round, and reactive to light. Conjunctivae and EOM are normal. Right eye exhibits no discharge. Left eye exhibits no discharge. No scleral icterus.  Neck: Normal range of motion. Neck supple.  Cardiovascular: Normal rate and regular rhythm. Exam reveals no gallop and no friction rub.  No murmur heard. Pulmonary/Chest: Effort normal and breath sounds normal. No stridor. No respiratory distress. She has no rales.  Abdominal: Soft. She exhibits no distension. There is no tenderness.  Musculoskeletal: She exhibits no edema or tenderness.  Neurological: She is alert and oriented to person, place, and time.  Skin: Skin is warm and dry. No rash noted. She is not diaphoretic. No  erythema.  Psychiatric: She has a normal mood and affect.  Vitals reviewed.   ED Results and Treatments Labs (all labs ordered are listed, but only abnormal results are displayed) Labs Reviewed  BASIC METABOLIC PANEL - Abnormal; Notable for the following components:      Result Value   Potassium 3.4 (*)    Glucose, Bld 106 (*)    All other components within normal limits  CBC - Abnormal; Notable for the following components:   Platelets 438 (*)    All other components within normal limits  BRAIN NATRIURETIC PEPTIDE  I-STAT TROPONIN, ED  I-STAT BETA HCG BLOOD, ED ([facility], [facility], [facility] ONLY)                                                                                                                         EKG  EKG Interpretation  Date/Time:  Friday February 28 2018 23:10:05 EDT Ventricular Rate:  89 PR Interval:    QRS Duration: 90 QT Interval:  399 QTC Calculation: 486 R Axis:   -55 Text Interpretation:  Sinus rhythm LAE, consider biatrial enlargement Left anterior fascicular block Abnormal R-wave progression, late transition Nonspecific T abnrm, anterolateral leads Borderline prolonged QT interval No significant change since last tracing Confirmed by Drema Pry [id number]) on 03/01/2018 1:09:11 AM      Radiology Dg Chest 2 View  Result Date: 02/28/2018 CLINICAL DATA:  Shortness of breath. EXAM: CHEST - 2 VIEW COMPARISON:  Radiographs of Dec 12, 2016. FINDINGS: The heart size and mediastinal contours are within normal limits. Both lungs are clear. No pneumothorax or pleural effusion is noted. The visualized skeletal structures are unremarkable. IMPRESSION: No active cardiopulmonary disease. Electronically Signed   By: Lupita Raider, M.D.   On: 02/28/2018 23:40   Pertinent labs & imaging results that were available during my care of the patient were reviewed by me and  considered in my medical decision making (see chart for details).  Medications Ordered in ED Medications  albuterol  (PROVENTIL) (2.5 MG/3ML) 0.083% nebulizer solution 5 mg (5 mg Nebulization Given 03/01/18 0129)                                                                                                                                    Procedures Procedures  (including critical care time)  Medical Decision Making / ED Course I have reviewed the nursing notes for this encounter and the patient's prior records (if available in EHR or on provided paperwork).    Telemetry noted patient had paired PVCs that were few beats and nonsustained.  EKG without acute ischemic changes or evidence of pericarditis.  Troponin and BNP negative.  Labs without significant electrolyte derangements or renal insufficiency.  No anemia.  Chest x-ray without evidence suggestive of pneumonia, pneumothorax, pneumomediastinum.  No abnormal contour of the mediastinum to suggest dissection. No evidence of acute injuries.  Recommend follow-up with cardiology.   The patient appears reasonably screened and/or stabilized for discharge and I doubt any other medical condition or other [facility] requiring further screening, evaluation, or treatment in the ED at this time prior to discharge.  The patient is safe for discharge with strict return precautions.   Final Clinical Impression(s) / ED Diagnoses Final diagnoses:  Palpitation    Disposition: Discharge  Condition: Good  I have discussed the results, Dx and Tx plan with the patient who expressed understanding and agree(s) with the plan. Discharge instructions discussed at great length. The patient was given strict return precautions who verbalized understanding of the instructions. No further questions at time of discharge.    ED Discharge Orders    None       Follow Up: Cardiology  Schedule an appointment as soon as possible for a visit  As needed     This chart was dictated using voice recognition software.  Despite best efforts to proofread,  errors can occur which  can change the documentation meaning.   Nira Conn, MD 03/01/18 [phone]

## 2018-06-01 ENCOUNTER — Emergency Department (HOSPITAL_COMMUNITY)
Admission: EM | Admit: 2018-06-01 | Discharge: 2018-06-01 | Disposition: A | Discharge status: Home / Self Care | Payer: Self-pay | Attending: Emergency Medicine | Admitting: Emergency Medicine

## 2018-06-01 ENCOUNTER — Encounter (HOSPITAL_COMMUNITY): Payer: Self-pay | Admitting: Emergency Medicine

## 2018-06-01 ENCOUNTER — Emergency Department (HOSPITAL_COMMUNITY): Payer: Self-pay

## 2018-06-01 DIAGNOSIS — F17210 Nicotine dependence, cigarettes, uncomplicated: Secondary | ICD-10-CM | POA: Insufficient documentation

## 2018-06-01 DIAGNOSIS — M25552 Pain in left hip: Secondary | ICD-10-CM | POA: Insufficient documentation

## 2018-06-01 DIAGNOSIS — M79675 Pain in left toe(s): Secondary | ICD-10-CM | POA: Insufficient documentation

## 2018-06-01 DIAGNOSIS — I252 Old myocardial infarction: Secondary | ICD-10-CM | POA: Insufficient documentation

## 2018-06-01 DIAGNOSIS — R600 Localized edema: Secondary | ICD-10-CM | POA: Insufficient documentation

## 2018-06-01 DIAGNOSIS — J40 Bronchitis, not specified as acute or chronic: Secondary | ICD-10-CM | POA: Insufficient documentation

## 2018-06-01 DIAGNOSIS — R0609 Other forms of dyspnea: Secondary | ICD-10-CM | POA: Insufficient documentation

## 2018-06-01 DIAGNOSIS — R51 Headache: Secondary | ICD-10-CM | POA: Insufficient documentation

## 2018-06-01 DIAGNOSIS — I10 Essential (primary) hypertension: Secondary | ICD-10-CM | POA: Insufficient documentation

## 2018-06-01 LAB — CBC WITH DIFFERENTIAL/PLATELET
Abs Immature Granulocytes: 0.03 10*3/uL (ref 0.00–0.07)
Basophils Absolute: 0 10*3/uL (ref 0.0–0.1)
Basophils Relative: 0 %
EOS PCT: 1 %
Eosinophils Absolute: 0.1 10*3/uL (ref 0.0–0.5)
HEMATOCRIT: 37 % (ref 36.0–46.0)
Hemoglobin: 11.8 g/dL — ABNORMAL LOW (ref 12.0–15.0)
Immature Granulocytes: 0 %
LYMPHS ABS: 2 10*3/uL (ref 0.7–4.0)
Lymphocytes Relative: 24 %
MCH: 28.9 pg (ref 26.0–34.0)
MCHC: 31.9 g/dL (ref 30.0–36.0)
MCV: 90.7 fL (ref 80.0–100.0)
MONO ABS: 1.3 10*3/uL — AB (ref 0.1–1.0)
Monocytes Relative: 15 %
Neutro Abs: 5.1 10*3/uL (ref 1.7–7.7)
Neutrophils Relative %: 60 %
Platelets: 396 10*3/uL (ref 150–400)
RBC: 4.08 MIL/uL (ref 3.87–5.11)
RDW: 13.6 % (ref 11.5–15.5)
WBC: 8.6 10*3/uL (ref 4.0–10.5)
nRBC: 0 % (ref 0.0–0.2)

## 2018-06-01 LAB — BASIC METABOLIC PANEL
Anion gap: 12 (ref 5–15)
BUN: 12 mg/dL (ref 6–20)
CHLORIDE: 103 mmol/L (ref 98–111)
CO2: 27 mmol/L (ref 22–32)
CREATININE: 0.78 mg/dL (ref 0.44–1.00)
Calcium: 9.1 mg/dL (ref 8.9–10.3)
GFR calc Af Amer: >60 mL/min (ref >60)
GFR calc non Af Amer: >60 mL/min (ref >60)
Glucose, Bld: 114 mg/dL — ABNORMAL HIGH (ref 70–99)
Potassium: 2.8 mmol/L — ABNORMAL LOW (ref 3.5–5.1)
SODIUM: 142 mmol/L (ref 135–145)

## 2018-06-01 LAB — I-STAT TROPONIN, ED: Troponin i, poc: 0 ng/mL (ref 0.00–0.08)

## 2018-06-01 LAB — MAGNESIUM: MAGNESIUM: 2 mg/dL (ref 1.7–2.4)

## 2018-06-01 LAB — BRAIN NATRIURETIC PEPTIDE: B Natriuretic Peptide: 17 pg/mL (ref 0.0–100.0)

## 2018-06-01 LAB — D-DIMER, QUANTITATIVE: D-Dimer, Quant: 0.88 ug/mL-FEU — ABNORMAL HIGH (ref 0.00–0.50)

## 2018-06-01 MED ORDER — IOPAMIDOL (ISOVUE-370) INJECTION 76%
INTRAVENOUS | Status: AC
  Administered 2018-06-01: 80 mL
  Filled 2018-06-01: qty 100

## 2018-06-01 MED ORDER — POTASSIUM CHLORIDE CRYS ER 10 MEQ PO TBCR
40.0000 meq | EXTENDED_RELEASE_TABLET | Freq: Once | ORAL | Status: AC
  Administered 2018-06-01: 40 meq via ORAL
  Filled 2018-06-01: qty 4

## 2018-06-01 MED ORDER — ALBUTEROL SULFATE (2.5 MG/3ML) 0.083% IN NEBU
5.0000 mg | INHALATION_SOLUTION | Freq: Once | RESPIRATORY_TRACT | Status: AC
  Administered 2018-06-01: 5 mg via RESPIRATORY_TRACT
  Filled 2018-06-01: qty 6

## 2018-06-01 NOTE — ED Provider Notes (Signed)
Complains of nonproductive cough onset yesterday and shortness of breath she treated herself with butyryl, without relief.  She continues to smoke.  She last smoked 30 minutes prior to coming to the emergency department.  She complains of chest pain only with cough.  No other associated symptoms.  She also complains of mild discomfort in her right great toe for the past 3 to 4 months, which is unchanged.  Exam no distress, speaks in paragraphs, coughing occasionally.  Lungs with scant rhonchi, diffusely.  Abdomen obese, nontender extremities without edema.  Left great toe appears scarred at the medial aspect.  There is no ingrown toenail.  No redness no warmth or swelling. CT scan report reviewed. Received oral potassium supplementation here.  I do not feel the patient requires LFTs.  She has no abdominal or GI complaints. Symptoms consistent with acute bronchitis chestX-ray reviewed by me.  Pulse oximetry on room air is normal. lab Work consistent with hypokalemia Counseled patient for 5 minutes on smoking cessation.  Referral primary care Results for orders placed or performed during the hospital encounter of 06/01/18  Basic metabolic panel  Result Value Ref Range   Sodium 142 135 - 145 mmol/L   Potassium 2.8 (L) 3.5 - 5.1 mmol/L   Chloride 103 98 - 111 mmol/L   CO2 27 22 - 32 mmol/L   Glucose, Bld 114 (H) 70 - 99 mg/dL   BUN 12 6 - 20 mg/dL   Creatinine, Ser 1.61 0.44 - 1.00 mg/dL   Calcium 9.1 8.9 - 09.6 mg/dL   GFR calc non Af Amer >60 >60 mL/min   GFR calc Af Amer >60 >60 mL/min   Anion gap 12 5 - 15  CBC with Differential  Result Value Ref Range   WBC 8.6 4.0 - 10.5 K/uL   RBC 4.08 3.87 - 5.11 MIL/uL   Hemoglobin 11.8 (L) 12.0 - 15.0 g/dL   HCT 04.5 40.9 - 81.1 %   MCV 90.7 80.0 - 100.0 fL   MCH 28.9 26.0 - 34.0 pg   MCHC 31.9 30.0 - 36.0 g/dL   RDW 91.4 78.2 - 95.6 %   Platelets 396 150 - 400 K/uL   nRBC 0.0 0.0 - 0.2 %   Neutrophils Relative % 60 %   Neutro Abs 5.1 1.7 - 7.7  K/uL   Lymphocytes Relative 24 %   Lymphs Abs 2.0 0.7 - 4.0 K/uL   Monocytes Relative 15 %   Monocytes Absolute 1.3 (H) 0.1 - 1.0 K/uL   Eosinophils Relative 1 %   Eosinophils Absolute 0.1 0.0 - 0.5 K/uL   Basophils Relative 0 %   Basophils Absolute 0.0 0.0 - 0.1 K/uL   Immature Granulocytes 0 %   Abs Immature Granulocytes 0.03 0.00 - 0.07 K/uL  Brain natriuretic peptide  Result Value Ref Range   B Natriuretic Peptide 17.0 0.0 - 100.0 pg/mL  D-dimer, quantitative  Result Value Ref Range   D-Dimer, Quant 0.88 (H) 0.00 - 0.50 ug/mL-FEU  Magnesium  Result Value Ref Range   Magnesium 2.0 1.7 - 2.4 mg/dL  I-stat troponin, ED  Result Value Ref Range   Troponin i, poc 0.00 0.00 - 0.08 ng/mL   Comment 3           Dg Chest 2 View  Result Date: 06/01/2018 CLINICAL DATA:  Shortness of breath EXAM: CHEST - 2 VIEW COMPARISON:  February 28, 2018 FINDINGS: No edema or consolidation. Heart size and pulmonary vascularity are normal. No adenopathy. No  bone lesions. IMPRESSION: No edema or consolidation. Electronically Signed   By: Bretta Bang III M.D.   On: 06/01/2018 03:08   Ct Angio Chest Pe W/cm &/or Wo Cm  Result Date: 06/01/2018 CLINICAL DATA:  Elevated D-dimer. Shortness of breath with exertion. Evaluate for pulmonary embolism. EXAM: CT ANGIOGRAPHY CHEST WITH CONTRAST TECHNIQUE: Multidetector CT imaging of the chest was performed using the standard protocol during bolus administration of intravenous contrast. Multiplanar CT image reconstructions and MIPs were obtained to evaluate the vascular anatomy. CONTRAST:  80mL ISOVUE-370 IOPAMIDOL (ISOVUE-370) INJECTION 76% COMPARISON:  Chest radiograph-06/01/2018 FINDINGS: Examination is degraded due to patient body habitus and associated quantum mottle artifact. Additionally, the examination is further degraded secondary to patient respiratory artifact. Vascular Findings: There is adequate opacification of the pulmonary arterial system with the main  pulmonary artery measuring 354 Hounsfield units. There are no discrete filling defects within the pulmonary arterial tree to the level of the subsegmental pulmonary arteries suggest pulmonary embolism. Evaluation of the distal subsegmental pulmonary arteries is degraded secondary to a combination quantum mottle artifact due to patient body habitus as well as patient respiratory artifact. Borderline enlarged caliber of the main pulmonary artery measuring 34 mm in diameter (image 55, series 5). Borderline cardiomegaly. Post coronary stent placement. No pericardial effusion. No evidence of thoracic aortic aneurysm or dissection on this nongated examination. Bovine configuration of the aortic arch. Additionally, the left vertebral artery appears to arise directly from the aortic arch. Branch vessels of the aortic arch appear patent throughout their imaged course. Review of the MIP images confirms the above findings. ---------------------------------------------------------------------------------- Nonvascular Findings: Mediastinum/Lymph Nodes: Bilateral axillary lymph nodes are numerous and mildly prominent though not enlarged by size criteria with index left axillary lymph node measures 0.7 cm in greatest short axis diameter (image 35, series 5) and index right axillary lymph node measuring 0.6 cm (image 34, series 5), favored to be reactive in etiology due to patient body habitus. No bulky mediastinal, hilar or axillary lymphadenopathy. Lungs/Pleura: Ill-defined geographic areas ground-glass favored to represent areas air trapping likely associated with patient body habitus and supine positioning. No discrete focal airspace opacities. No air bronchograms. No pleural effusion or pneumothorax. The central pulmonary airways appear patent without discrete area of bronchial wall thickening or occlusion. No discrete pulmonary nodules given marked limitation of the examination. Upper abdomen: Early arterial phase evaluation  of the upper abdomen suggests mild nodularity hepatic contour as could be seen in the setting of early cirrhotic change. Post cholecystectomy. Musculoskeletal: No acute or aggressive osseous abnormalities. DDD of C6-C7 with disc space height loss, endplate irregularity and sclerosis. Regional soft tissues appear normal. Normal appearance of the imaged portions of the thyroid gland. IMPRESSION: 1. No acute cardiopulmonary disease on this body habitus and motion degraded examination. Specifically, no evidence of pulmonary embolism to the level the bilateral subsegmental pulmonary arteries. 2. Borderline cardiomegaly with enlargement of the caliber of the main pulmonary artery as could be seen in the setting of pulmonary arterial hypertension. Further evaluation cardiac echo could be performed as clinically indicated. 3. Post coronary stent placement. 4. Mild nodularity hepatic contour as could be seen in the setting of early cirrhotic change. Correlation with LFTs be performed as indicated. 5. Post cholecystectomy. Electronically Signed   By: Simonne Come M.D.   On: 06/01/2018 09:04  Diagnoses #1 acute bronchitis #2 hypokalemia #3 tobacco abuse #4 chronic pain of left great toe   Doug Sou, MD 06/01/18 [phone]

## 2018-06-01 NOTE — ED Triage Notes (Signed)
Pt presents with sob since 3p and gradually worsening; pt states hx of asthma but hasnt bothered her in years; used inhaler and neb tx with out improvement; pt states she was driving when it started

## 2018-06-01 NOTE — ED Notes (Signed)
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 Reports she is unable to drink water, and that she will only take these supplements with apple juice. Provided.

## 2018-06-01 NOTE — ED Provider Notes (Signed)
[facility] EMERGENCY DEPARTMENT Provider Note   CSN: [id number] Arrival date & time: 06/01/18  0159     History   Chief Complaint Chief Complaint  Patient presents with  . Shortness of Breath  . Toe Pain    HPI Forrest Demuro is a 42 y.o. female.  Patient is a 42 year old female with a history of morbid obesity, prior MI, hypertension and reported asthma who presents with shortness of breath.  She reports that this started earlier today and she feels like it is her whole left side.  She has a headache on the left side and has pain in her left hip and left toe.  She feels like her shortness of breath is only on the left side.  She denies any associated chest pain.  It is not particularly worse with exertion.  She has some chronic swelling of her right leg but now she says both of her legs are little bit swollen.  She denies any fevers.  She has a little bit of cough which is nonproductive.  No URI symptoms.  No nausea or vomiting.  No abdominal pain.  She does report some left toe pain and says that she is been dealing with an infection in her toe for several months.  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     Past Medical History:  Diagnosis Date  . Hypertension   . MI (myocardial infarction) (HCC)   . Pyloric stenosis     Patient Active Problem List   Diagnosis Date Noted  . Solitary pulmonary nodule 10/16/2016  . Dyspnea on exertion 10/16/2016  . Upper airway cough syndrome 09/18/2016  . Essential hypertension 09/18/2016  . Morbid (severe) obesity due to excess calories (HCC) 09/18/2016  . Cigarette smoker 09/18/2016    Past Surgical History:  Procedure Laterality Date  . APPENDECTOMY    . cervix     [place]  . CHOLECYSTECTOMY    . MEDIAL PARTIAL KNEE REPLACEMENT       OB History   None      Home Medications     Prior to Admission medications   Medication Sig Start Date End Date Taking? Authorizing Provider  aspirin-acetaminophen-caffeine (EXCEDRIN MIGRAINE) [phone] MG tablet Take 1 tablet by mouth every 6 (six) hours as needed for headache.    [provider]    Family History Family History  Problem Relation Age of Onset  . Allergies Mother   . Asthma Mother   . Asthma Father   . Stomach cancer Maternal Grandmother     Social History Social History   Tobacco Use  . Smoking status: Current Every Day Smoker    Packs/day: 0.50    Years: 20.00    Pack years: 10.00    Types: Cigarettes  . Smokeless tobacco: Never Used  Substance Use Topics  . Alcohol use: No  . Drug use: No     Allergies   Demerol  [meperidine hcl]; Iodinated diagnostic agents; Magnesium sulfate; and Meperidine   Review of Systems Review of Systems  Constitutional: Positive for fatigue. Negative for chills, diaphoresis and fever.  HENT: Negative for congestion, rhinorrhea and sneezing.   Eyes: Negative.   Respiratory: Positive for shortness of breath. Negative for cough and chest tightness.   Cardiovascular: Negative for chest pain and leg swelling.  Gastrointestinal: Negative for abdominal pain, blood  in stool, diarrhea, nausea and vomiting.  Genitourinary: Negative for difficulty urinating, flank pain, frequency and hematuria.  Musculoskeletal: Positive for arthralgias. Negative for back pain.  Skin: Negative for rash.  Neurological: Negative for dizziness, speech difficulty, weakness, numbness and headaches.     Physical Exam Updated Vital Signs BP 137/75   Pulse 90   Temp 99.6 F (37.6 C) (Oral)   Resp (!) 29   Ht 5\' 9"  (1.753 m)   Wt (!) 138.8 kg   SpO2 94%   BMI 45.19 kg/m   Physical Exam  Constitutional: She is oriented to person, place, and time. She appears well-developed and well-nourished.  HENT:  Head: Normocephalic and atraumatic.  Eyes: Pupils are equal, round, and  reactive to light.  Neck: Normal range of motion. Neck supple.  Cardiovascular: Normal rate, regular rhythm and normal heart sounds.  Pulmonary/Chest: Effort normal. No respiratory distress. She has no wheezes. She has no rales. She exhibits no tenderness.  Abdominal: Soft. Bowel sounds are normal. There is no tenderness. There is no rebound and no guarding.  Musculoskeletal: Normal range of motion.       Right lower leg: She exhibits edema.       Left lower leg: She exhibits edema.  1+ pitting edema to lower extremities bilaterally, no calf tenderness.  There is some discoloration of the medial surface of the left big toe.  There is no drainage.  No erythema or warmth.  No signs of infection.  Pedal pulses are intact.  Lymphadenopathy:    She has no cervical adenopathy.  Neurological: She is alert and oriented to person, place, and time.  Skin: Skin is warm and dry. No rash noted.  Psychiatric: She has a normal mood and affect.     ED Treatments / Results  Labs (all labs ordered are listed, but only abnormal results are displayed) Labs Reviewed  BASIC METABOLIC PANEL - Abnormal; Notable for the following components:      Result Value   Potassium 2.8 (*)    Glucose, Bld 114 (*)    All other components within normal limits  CBC WITH DIFFERENTIAL/PLATELET - Abnormal; Notable for the following components:   Hemoglobin 11.8 (*)    Monocytes Absolute 1.3 (*)    All other components within normal limits  D-DIMER, QUANTITATIVE (NOT AT [facility]) - Abnormal; Notable for the following components:   D-Dimer, Quant 0.88 (*)    All other components within normal limits  BRAIN NATRIURETIC PEPTIDE  I-STAT TROPONIN, ED    EKG EKG Interpretation  Date/Time:  Sunday June 01 2018 02:04:46 EDT Ventricular Rate:  107 PR Interval:  158 QRS Duration: 92 QT Interval:  356 QTC Calculation: 475 R Axis:   -47 Text Interpretation:  Sinus tachycardia with frequent Premature ventricular complexes  Biatrial enlargement Pulmonary disease pattern Left anterior fascicular block Nonspecific T wave abnormality Abnormal ECG Confirmed by Rolan Bucco [phone]) on 06/01/2018 5:20:41 AM   Radiology Dg Chest 2 View  Result Date: 06/01/2018 CLINICAL DATA:  Shortness of breath EXAM: CHEST - 2 VIEW COMPARISON:  February 28, 2018 FINDINGS: No edema or consolidation. Heart size and pulmonary vascularity are normal. No adenopathy. No bone lesions. IMPRESSION: No edema or consolidation. Electronically Signed   By: Bretta Bang III M.D.   On: 06/01/2018 03:08    Procedures Procedures (including critical care time)  Medications Ordered in ED Medications  potassium chloride SA (K-DUR,KLOR-CON) CR tablet 40 mEq (has no administration in time range)  albuterol (PROVENTIL) (  2.5 MG/3ML) 0.083% nebulizer solution 5 mg (5 mg Nebulization Given 06/01/18 0216)     Initial Impression / Assessment and Plan / ED Course  I have reviewed the triage vital signs and the nursing notes.  Pertinent labs & imaging results that were available during my care of the patient were reviewed by me and considered in my medical decision making (see chart for details).     Patient is a 42 year old female who presents with shortness of breath.  She has no hypoxia.  No significant increased work of breathing.  Her EKG does not show ischemic changes or arrhythmias.  Her troponin is negative.  Her d-dimer is elevated.  CT scan has been ordered to rule out PE.  She reports a contrast allergy but only to MR contrast.  She states when she had MR contrast it makes her nauseated and sometimes she has vomiting.  It does not appear that she has a true allergy or anaphylaxis and she states that she is had no problem with contrast related to CT scans.  Dr. Rennis Chris to take over care pending CT. her potassium is low and she was given some potassium replacement.  Final Clinical Impressions(s) / ED Diagnoses   Final diagnoses:  Dyspnea on  exertion    ED Discharge Orders    None       Rolan Bucco, MD 06/01/18 0745

## 2018-06-01 NOTE — Discharge Instructions (Signed)
Use your albuterol inhaler 2 puffs every 4 hours as needed for cough or shortness of breath.  Call the number on these instructions tomorrow to get a primary care physician.  Ask your new primary care physician to help you to stop smoking.

## 2018-06-01 NOTE — ED Triage Notes (Signed)
Pt adds that her L great toe is infected

## 2018-07-10 ENCOUNTER — Encounter (HOSPITAL_COMMUNITY): Payer: Self-pay

## 2018-07-10 ENCOUNTER — Emergency Department (HOSPITAL_COMMUNITY): Payer: Medicaid Other

## 2018-07-10 ENCOUNTER — Emergency Department (HOSPITAL_COMMUNITY)
Admission: EM | Admit: 2018-07-10 | Discharge: 2018-07-10 | Disposition: A | Discharge status: Home / Self Care | Payer: Medicaid Other | Attending: Emergency Medicine | Admitting: Emergency Medicine

## 2018-07-10 DIAGNOSIS — I493 Ventricular premature depolarization: Secondary | ICD-10-CM | POA: Insufficient documentation

## 2018-07-10 DIAGNOSIS — Z7982 Long term (current) use of aspirin: Secondary | ICD-10-CM | POA: Insufficient documentation

## 2018-07-10 DIAGNOSIS — R0789 Other chest pain: Secondary | ICD-10-CM

## 2018-07-10 DIAGNOSIS — Z9119 Patient's noncompliance with other medical treatment and regimen: Secondary | ICD-10-CM

## 2018-07-10 DIAGNOSIS — Z79899 Other long term (current) drug therapy: Secondary | ICD-10-CM | POA: Insufficient documentation

## 2018-07-10 DIAGNOSIS — F17210 Nicotine dependence, cigarettes, uncomplicated: Secondary | ICD-10-CM | POA: Insufficient documentation

## 2018-07-10 DIAGNOSIS — I10 Essential (primary) hypertension: Secondary | ICD-10-CM | POA: Insufficient documentation

## 2018-07-10 DIAGNOSIS — R002 Palpitations: Secondary | ICD-10-CM

## 2018-07-10 DIAGNOSIS — Z91199 Patient's noncompliance with other medical treatment and regimen due to unspecified reason: Secondary | ICD-10-CM

## 2018-07-10 LAB — CBC
HCT: 38.5 % (ref 36.0–46.0)
HEMOGLOBIN: 12 g/dL (ref 12.0–15.0)
MCH: 28.6 pg (ref 26.0–34.0)
MCHC: 31.2 g/dL (ref 30.0–36.0)
MCV: 91.9 fL (ref 80.0–100.0)
Platelets: 415 10*3/uL — ABNORMAL HIGH (ref 150–400)
RBC: 4.19 MIL/uL (ref 3.87–5.11)
RDW: 13.7 % (ref 11.5–15.5)
WBC: 9.5 10*3/uL (ref 4.0–10.5)
nRBC: 0 % (ref 0.0–0.2)

## 2018-07-10 LAB — BASIC METABOLIC PANEL
ANION GAP: 11 (ref 5–15)
BUN: 13 mg/dL (ref 6–20)
CO2: 24 mmol/L (ref 22–32)
Calcium: 8.8 mg/dL — ABNORMAL LOW (ref 8.9–10.3)
Chloride: 106 mmol/L (ref 98–111)
Creatinine, Ser: 0.77 mg/dL (ref 0.44–1.00)
GFR calc Af Amer: >60 mL/min (ref >60)
GFR calc non Af Amer: >60 mL/min (ref >60)
GLUCOSE: 125 mg/dL — AB (ref 70–99)
POTASSIUM: 3.1 mmol/L — AB (ref 3.5–5.1)
Sodium: 141 mmol/L (ref 135–145)

## 2018-07-10 LAB — I-STAT TROPONIN, ED
TROPONIN I, POC: 0.02 ng/mL (ref 0.00–0.08)
Troponin i, poc: 0 ng/mL (ref 0.00–0.08)

## 2018-07-10 LAB — I-STAT BETA HCG BLOOD, ED (MC, WL, AP ONLY): I-stat hCG, quantitative: <5 m[IU]/mL (ref <5)

## 2018-07-10 LAB — D-DIMER, QUANTITATIVE (NOT AT ARMC): D DIMER QUANT: 0.33 ug{FEU}/mL (ref 0.00–0.50)

## 2018-07-10 MED ORDER — LISINOPRIL 10 MG PO TABS: 10.0000 mg | ORAL_TABLET | Freq: Every day | ORAL | 0 refills | Status: DC

## 2018-07-10 MED ORDER — METOPROLOL SUCCINATE ER 50 MG PO TB24: 50.0000 mg | ORAL_TABLET | Freq: Every day | ORAL | 0 refills | Status: DC

## 2018-07-10 MED ORDER — VITAMIN D 25 MCG (1000 UNIT) PO TABS: 1000.0000 [IU] | ORAL_TABLET | Freq: Every day | ORAL | 0 refills | Status: AC

## 2018-07-10 MED ORDER — ASPIRIN EC 81 MG PO TBEC: 81.0000 mg | DELAYED_RELEASE_TABLET | Freq: Every day | ORAL | 0 refills | Status: AC

## 2018-07-10 MED ORDER — POTASSIUM CHLORIDE CRYS ER 20 MEQ PO TBCR: 20.0000 meq | EXTENDED_RELEASE_TABLET | Freq: Two times a day (BID) | ORAL | 0 refills | Status: DC

## 2018-07-10 NOTE — ED Triage Notes (Signed)
Pt endorses sudden onset of chest pain and shob 25 minutes ago while sitting. Pt has hx of 3 MI's and this  Feels the same. Taken back to triage for EKG.

## 2018-07-10 NOTE — Discharge Instructions (Addendum)
You were seen in the ER for palpitations, chest discomfort.   Work up today was reassuring.  EKG showed intermittent PVC (premature ventricular contractions). This could cause mild palpitations.    You are considered safe for discharge with cardiology follow up within 30 days. Call cardiology tomorrow and make an appointment as soon as able.   Resume all your medications.   Your potassium was slightly low. Start potassium supplement 20 meq daily for 7 days.    Please return to ED if: Your chest pain or shortness of breath with exertion. You have a cough that gets worse, or you cough up blood. You have severe pain in your abdomen. You have severe weakness. You faint. You have sudden, unexplained discomfort in your arms, back, neck, or jaw. You suddenly start to sweat, or your skin gets clammy. You feel nauseous or you vomit. You suddenly feel light-headed or dizzy. Your heart begins to beat quickly, or it feels like it is skipping beats.

## 2018-07-10 NOTE — ED Provider Notes (Signed)
[facility] EMERGENCY DEPARTMENT Provider Note   CSN: [id number] Arrival date & time: 07/10/18  1610     History   Chief Complaint Chief Complaint  Patient presents with  . Chest Pain    HPI Julia Ingram is a 42 y.o. female with history of obesity, hypertension, hyperlipidemia, tobacco use, CAD status post MI and 2 stents is here for evaluation of palpitations.  States she has a long history of palpitations for several months, typically they feel like her heart is beating really fast, hard and then it skips.  It feels like she just ran a marathon.  These typically last no more than 5 minutes.  However, today she had a 30-minute episode of these palpitations while she was driving.  She had associated lightheadedness, feeling like she was going to faint and chest discomfort.  Her chest discomfort is described as a burning sensation from her neck to her sternum, it was constant for the 30 minutes.  It was nonradiating, nonpleuritic.  Her wife told her she needed to come to the ER.  States that in the last few months her palpitations have felt similarly and she is also had chest pain however today it lasted longer.  Admits to medical noncompliance for the last 2 to 3 months because she lost her insurance.  She was supposed to have a stress test but could not do it due to lack of insurance.  Her last MI was 5 years ago.  She had a recent long car trip to [place] lasting approximately 10 to 12 hours in the car.  She denies history of blood clots.  She denies hemoptysis, pleuritic chest pain, calf pain or swelling.  HPI  Past Medical History:  Diagnosis Date  . Hypertension   . MI (myocardial infarction) (HCC)   . Pyloric stenosis     Patient Active Problem List   Diagnosis Date Noted  . Solitary pulmonary nodule 10/16/2016  . Dyspnea on exertion 10/16/2016  . Upper airway cough syndrome 09/18/2016  . Essential hypertension 09/18/2016  . Morbid (severe) obesity due to  excess calories (HCC) 09/18/2016  . Cigarette smoker 09/18/2016    Past Surgical History:  Procedure Laterality Date  . APPENDECTOMY    . cervix     [facility]  . CHOLECYSTECTOMY    . MEDIAL PARTIAL KNEE REPLACEMENT       OB History   None      Home Medications    Prior to Admission medications   Medication Sig Start Date End Date Taking? Authorizing Provider  aspirin-acetaminophen-caffeine (EXCEDRIN MIGRAINE) [phone] MG tablet Take 1 tablet by mouth every 6 (six) hours as needed for headache.   Yes [provider]  aspirin EC 81 MG tablet Take 1 tablet (81 mg total) by mouth daily. 07/10/18 08/09/18  Liberty Handy, PA-C  cholecalciferol (VITAMIN D3) 25 MCG (1000 UT) tablet Take 1 tablet (1,000 Units total) by mouth daily. 07/10/18 08/09/18  Liberty Handy, PA-C  lisinopril (PRINIVIL,ZESTRIL) 10 MG tablet Take 1 tablet (10 mg total) by mouth daily. 07/10/18 08/09/18  Liberty Handy, PA-C  metoprolol succinate (TOPROL-XL) 50 MG 24 hr tablet Take 1 tablet (50 mg total) by mouth daily. Take with or immediately following a meal. 07/10/18 08/09/18  Liberty Handy, PA-C  potassium chloride SA (K-DUR,KLOR-CON) 20 MEQ tablet Take 1 tablet (20 mEq total) by mouth 2 (two) times daily for 7 days. 07/10/18 07/17/18  Liberty Handy, PA-C  Family History Family History  Problem Relation Age of Onset  . Allergies Mother   . Asthma Mother   . Asthma Father   . Stomach cancer Maternal Grandmother     Social History Social History   Tobacco Use  . Smoking status: Current Every Day Smoker    Packs/day: 0.50    Years: 20.00    Pack years: 10.00    Types: Cigarettes  . Smokeless tobacco: Never Used  Substance Use Topics  . Alcohol use: No  . Drug use: No     Allergies   Demerol  [meperidine hcl]; Magnesium sulfate; Meperidine; and Gadolinium derivatives   Review of Systems Review of Systems  Cardiovascular: Positive for chest pain and palpitations.    Neurological: Positive for light-headedness.  All other systems reviewed and are negative.    Physical Exam Updated Vital Signs BP (!) 135/100   Pulse 75   Temp 98 F (36.7 C) (Oral)   Resp (!) 25   SpO2 99%   Physical Exam  Constitutional: She appears well-developed and well-nourished.  NAD. Non toxic.   HENT:  Head: Normocephalic and atraumatic.  Nose: Nose normal.  Eyes: Conjunctivae, EOM and lids are normal.  Neck: Trachea normal and normal range of motion.  Trachea midline.   Cardiovascular: Normal rate, regular rhythm, S1 normal, S2 normal and normal heart sounds.  Pulses:      Radial pulses are 2+ on the right side, and 2+ on the left side.       Dorsalis pedis pulses are 2+ on the right side, and 2+ on the left side.  No LE edema or calf tenderness.   Pulmonary/Chest: Effort normal and breath sounds normal.  No anterior chest wall tenderness. CP not reproducible with AROM of upper extremities.  Abdominal: Soft. Bowel sounds are normal. There is no tenderness.  No epigastric tenderness. No distention.   Neurological: She is alert. GCS eye subscore is 4. GCS verbal subscore is 5. GCS motor subscore is 6.  Skin: Skin is warm and dry. Capillary refill takes less than 2 seconds.  No rash to chest wall  Psychiatric: Her speech is normal and behavior is normal. Thought content normal. Cognition and memory are normal.     ED Treatments / Results  Labs (all labs ordered are listed, but only abnormal results are displayed) Labs Reviewed  BASIC METABOLIC PANEL - Abnormal; Notable for the following components:      Result Value   Potassium 3.1 (*)    Glucose, Bld 125 (*)    Calcium 8.8 (*)    All other components within normal limits  CBC - Abnormal; Notable for the following components:   Platelets 415 (*)    All other components within normal limits  D-DIMER, QUANTITATIVE (NOT AT [facility])  I-STAT TROPONIN, ED  I-STAT BETA HCG BLOOD, ED ([facility], [facility], [facility] ONLY)  I-STAT  TROPONIN, ED    EKG EKG Interpretation  Date/Time:  Thursday July 10 2018 17:50:15 EST Ventricular Rate:  75 PR Interval:  162 QRS Duration: 95 QT Interval:  429 QTC Calculation: 480 R Axis:   -61 Text Interpretation:  Sinus rhythm Probable left atrial enlargement Left anterior fascicular block Left ventricular hypertrophy Nonspecific T abnrm, anterolateral leads No significant change was found Confirmed by Azalia Bilis ([id number]) on 07/10/2018 7:34:08 PM   Radiology Dg Chest 2 View  Result Date: 07/10/2018 CLINICAL DATA:  Chest pain EXAM: CHEST - 2 VIEW COMPARISON:  CT and CXR 06/01/2018 FINDINGS: Stable  borderline cardiomegaly. Stable mediastinal contours without aortic aneurysm. Lungs are clear. No acute osseous abnormality. IMPRESSION: No active cardiopulmonary disease. Electronically Signed   By: Tollie Ethavid  Kwon M.D.   On: 07/10/2018 16:59    Procedures Procedures (including critical care time)  Medications Ordered in ED Medications - No data to display   Initial Impression / Assessment and Plan / ED Course  I have reviewed the triage vital signs and the nursing notes.  Pertinent labs & imaging results that were available during my care of the patient were reviewed by me and considered in my medical decision making (see chart for details).     42 year old with significant cardiac history is here for palpitations, lightheadedness and what sounds like atypical chest pain.  Her symptoms were at rest, non exertional, intermittent for months, lasting longer today.  This is in the setting of medical noncompliance.  We will evaluate for cardiac ischemia, electrolyte abnormalities, symptomatic anemia.  She has no clinical signs of blood clot however she has been intermittently tachypneic, with recent prolonged travel and chest discomfort, we will add a d-dimer as well.  I have low suspicion for blood clot.  1930: Work-up including troponin x2, EKG x2, d-dimer unremarkable.  Mild  hypokalemia, patient has history of the same in the past.  No anemia.  Noted intermittent PVCs while on cardiac monitor otherwise no arrhythmias.  This could explain intermittent palpitations.  She admits to drinking a lot of sodas, caffeine as well as tobacco use and not taking her metoprolol could be leading to symptoms related to intermittent PVCs.  Pharmacy assisted with medication reconciliation.  Although she has cardiac history and risks, her symptomatology today is not consistent with cardiac ischemia.  She is considered appropriate for discharge at this time with cardiology follow-up in the next 30 days.  Stressed importance of medical compliance.  States that she should be able to afford her medicines.  Prescriptions given to her.  Return precautions given.  She is comfortable with this plan.  Final Clinical Impressions(s) / ED Diagnoses   Final diagnoses:  Palpitations  Atypical chest pain  Medical non-compliance  PVCs (premature ventricular contractions)    ED Discharge Orders         Ordered    metoprolol succinate (TOPROL-XL) 50 MG 24 hr tablet  Daily     07/10/18 2106    lisinopril (PRINIVIL,ZESTRIL) 10 MG tablet  Daily     07/10/18 2106    cholecalciferol (VITAMIN D3) 25 MCG (1000 UT) tablet  Daily     07/10/18 2106    aspirin EC 81 MG tablet  Daily     07/10/18 2106    potassium chloride SA (K-DUR,KLOR-CON) 20 MEQ tablet  2 times daily     07/10/18 2108           Jerrell MylarGibbons, Veida Spira J, PA-C 07/10/18 2129    Azalia Bilisampos, Kevin, MD 07/11/18 [id number]

## 2018-10-15 ENCOUNTER — Emergency Department (HOSPITAL_COMMUNITY)
Admission: EM | Admit: 2018-10-15 | Discharge: 2018-10-15 | Disposition: A | Discharge status: Home / Self Care | Payer: Self-pay | Attending: Emergency Medicine | Admitting: Emergency Medicine

## 2018-10-15 ENCOUNTER — Encounter (HOSPITAL_COMMUNITY): Payer: Self-pay

## 2018-10-15 ENCOUNTER — Other Ambulatory Visit: Payer: Self-pay

## 2018-10-15 ENCOUNTER — Emergency Department (HOSPITAL_COMMUNITY): Payer: Self-pay

## 2018-10-15 DIAGNOSIS — R0602 Shortness of breath: Secondary | ICD-10-CM | POA: Insufficient documentation

## 2018-10-15 DIAGNOSIS — M79604 Pain in right leg: Secondary | ICD-10-CM | POA: Insufficient documentation

## 2018-10-15 DIAGNOSIS — I10 Essential (primary) hypertension: Secondary | ICD-10-CM | POA: Insufficient documentation

## 2018-10-15 DIAGNOSIS — M79605 Pain in left leg: Secondary | ICD-10-CM | POA: Insufficient documentation

## 2018-10-15 DIAGNOSIS — F17210 Nicotine dependence, cigarettes, uncomplicated: Secondary | ICD-10-CM | POA: Insufficient documentation

## 2018-10-15 LAB — CBC
HCT: 38.1 % (ref 36.0–46.0)
Hemoglobin: 12.1 g/dL (ref 12.0–15.0)
MCH: 29 pg (ref 26.0–34.0)
MCHC: 31.8 g/dL (ref 30.0–36.0)
MCV: 91.4 fL (ref 80.0–100.0)
Platelets: 393 10*3/uL (ref 150–400)
RBC: 4.17 MIL/uL (ref 3.87–5.11)
RDW: 13.6 % (ref 11.5–15.5)
WBC: 9 10*3/uL (ref 4.0–10.5)
nRBC: 0 % (ref 0.0–0.2)

## 2018-10-15 LAB — BASIC METABOLIC PANEL
Anion gap: 9 (ref 5–15)
BUN: 8 mg/dL (ref 6–20)
CALCIUM: 8.9 mg/dL (ref 8.9–10.3)
CO2: 25 mmol/L (ref 22–32)
CREATININE: 0.67 mg/dL (ref 0.44–1.00)
Chloride: 107 mmol/L (ref 98–111)
GFR calc Af Amer: >60 mL/min (ref >60)
GFR calc non Af Amer: >60 mL/min (ref >60)
Glucose, Bld: 79 mg/dL (ref 70–99)
Potassium: 3.5 mmol/L (ref 3.5–5.1)
Sodium: 141 mmol/L (ref 135–145)

## 2018-10-15 LAB — BRAIN NATRIURETIC PEPTIDE: B Natriuretic Peptide: 34.5 pg/mL (ref 0.0–100.0)

## 2018-10-15 LAB — I-STAT TROPONIN, ED: TROPONIN I, POC: 0 ng/mL (ref 0.00–0.08)

## 2018-10-15 NOTE — ED Notes (Signed)
Patient transported to X-ray 

## 2018-10-15 NOTE — ED Triage Notes (Signed)
Pt reports bilateral leg swelling and numbness for the past 3 days.

## 2018-10-15 NOTE — ED Provider Notes (Signed)
[facility] Emergency Department Provider Note MRN:  [id number]  Arrival date & time: 10/15/18     Chief Complaint   Leg Swelling   History of Present Illness   Julia Ingram is a 43 y.o. year-old female with a history of CAD, MI status post stent placement, CHF presenting to the ED with chief complaint of leg swelling.  Bilateral leg swelling getting progressively worse for the past 4 to 5 days.  Feeling tingling sensation of bilateral feet related to the swelling.  Feeling more short of breath for the past 2 days, trouble laying flat, dyspnea on exertion.  Intermittent chest pain, center of the chest, described as a tightness.  No chest pain currently.  No shortness of breath.  Symptoms are moderate in severity, leg discomfort is constant, worse with motion.  Review of Systems  A complete 10 system review of systems was obtained and all systems are negative except as noted in the HPI and PMH.   Patient's Health History    Past Medical History:  Diagnosis Date  . Hypertension   . MI (myocardial infarction) (HCC)   . Pyloric stenosis     Past Surgical History:  Procedure Laterality Date  . APPENDECTOMY    . cervix     [place]  . CHOLECYSTECTOMY    . MEDIAL PARTIAL KNEE REPLACEMENT      Family History  Problem Relation Age of Onset  . Allergies Mother   . Asthma Mother   . Asthma Father   . Stomach cancer Maternal Grandmother     Social History   Socioeconomic History  . Marital status: Married    Spouse name: Not on file  . Number of children: Not on file  . Years of education: Not on file  . Highest education level: Not on file  Occupational History  . Not on file  Social Needs  . Financial resource strain: Not on file  . Food insecurity:    Worry: Not on file    Inability: Not on file  . Transportation needs:    Medical: Not on file    Non-medical: Not on file  Tobacco Use  . Smoking status: Current Every Day Smoker    Packs/day: 0.50     Years: 20.00    Pack years: 10.00    Types: Cigarettes  . Smokeless tobacco: Never Used  Substance and Sexual Activity  . Alcohol use: No  . Drug use: No  . Sexual activity: Yes  Lifestyle  . Physical activity:    Days per week: Not on file    Minutes per session: Not on file  . Stress: Not on file  Relationships  . Social connections:    Talks on phone: Not on file    Gets together: Not on file    Attends religious service: Not on file    Active member of club or organization: Not on file    Attends meetings of clubs or organizations: Not on file    Relationship status: Not on file  . Intimate partner violence:    Fear of current or ex partner: Not on file    Emotionally abused: Not on file    Physically abused: Not on file    Forced sexual activity: Not on file  Other Topics Concern  . Not on file  Social History Narrative  . Not on file     Physical Exam  Vital Signs and Nursing Notes reviewed Vitals:   10/15/18 1404  BP: (!) 158/97  Pulse: 79  Resp: 20  Temp: 98.3 F (36.8 C)  SpO2: 97%    CONSTITUTIONAL: Well-appearing, NAD NEURO:  Alert and oriented x 3, no focal deficits EYES:  eyes equal and reactive ENT/NECK:  no LAD, no JVD CARDIO: Regular rate, well-perfused, normal S1 and S2 PULM:  CTAB no wheezing or rhonchi GI/GU:  normal bowel sounds, non-distended, non-tender MSK/SPINE:  No gross deformities, scant edema bilateral lower extremities SKIN:  no rash, atraumatic PSYCH:  Appropriate speech and behavior  Diagnostic and Interventional Summary    EKG Interpretation  Date/Time:  Wednesday October 15 2018 12:53:36 EDT Ventricular Rate:  81 PR Interval:  162 QRS Duration: 94 QT Interval:  390 QTC Calculation: 453 R Axis:   -38 Text Interpretation:  Normal sinus rhythm Left axis deviation Cannot rule out Anterior infarct , age undetermined Abnormal ECG Confirmed by Kennis Carina [id number]) on 10/15/2018 4:05:39 PM      Labs Reviewed  CBC  BASIC  METABOLIC PANEL  BRAIN NATRIURETIC PEPTIDE  I-STAT TROPONIN, ED    DG Chest 2 View  Final Result      Medications - No data to display   Procedures Critical Care  ED Course and Medical Decision Making  I have reviewed the triage vital signs and the nursing notes.  Pertinent labs & imaging results that were available during my care of the patient were reviewed by me and considered in my medical decision making (see below for details).  Considering mild CHF exacerbation in this 43 year old female with history of the same.  Will need EKG, troponin, BNP to evaluate for ACS given the intermittent chest pain.  Lower extremity swelling is minimal, lungs clear, patient has normal oxygen saturations, no shortness of breath, little to no concern for VTE.  Labs, chest x-ray reassuring.  Patient advised to follow-up with a PCP or cardiologist.  After the discussed management above, the patient was determined to be safe for discharge.  The patient was in agreement with this plan and all questions regarding their care were answered.  ED return precautions were discussed and the patient will return to the ED with any significant worsening of condition.  Elmer Sow. Pilar Plate, MD [facility] Emergency Medicine [facility] [email or web address]  Final Clinical Impressions(s) / ED Diagnoses     ICD-10-CM   1. Pain in both lower extremities M79.604    M79.605   2. SOB (shortness of breath) R06.02 DG Chest 2 View    DG Chest 2 View    ED Discharge Orders    None         Sabas Sous, MD 10/15/18 1715

## 2018-10-15 NOTE — ED Notes (Signed)
Pt ambulated to room 34 after discharge to visit SO.

## 2018-10-15 NOTE — Discharge Instructions (Addendum)
You were evaluated in the Emergency Department and after careful evaluation, we did not find any emergent condition requiring admission or further testing in the hospital. ° °Please return to the Emergency Department if you experience any worsening of your condition.  We encourage you to follow up with a primary care provider.  Thank you for allowing us to be a part of your care. °

## 2018-10-15 NOTE — ED Notes (Signed)
Patient verbalizes understanding of discharge instructions. Opportunity for questioning and answers were provided. Armband removed by staff, pt discharged from ED.  

## 2019-06-04 ENCOUNTER — Encounter (HOSPITAL_COMMUNITY): Payer: Self-pay | Admitting: Emergency Medicine

## 2019-06-04 ENCOUNTER — Emergency Department (HOSPITAL_COMMUNITY): Payer: Medicaid - Out of State

## 2019-06-04 ENCOUNTER — Inpatient Hospital Stay (HOSPITAL_COMMUNITY)
Admission: EM | Admit: 2019-06-04 | Discharge: 2019-06-08 | DRG: 189 | Disposition: A | Discharge status: Home / Self Care | Payer: Medicaid - Out of State | Attending: Internal Medicine | Admitting: Internal Medicine

## 2019-06-04 ENCOUNTER — Other Ambulatory Visit: Payer: Self-pay

## 2019-06-04 DIAGNOSIS — Z888 Allergy status to other drugs, medicaments and biological substances status: Secondary | ICD-10-CM

## 2019-06-04 DIAGNOSIS — J45901 Unspecified asthma with (acute) exacerbation: Secondary | ICD-10-CM | POA: Diagnosis present

## 2019-06-04 DIAGNOSIS — Z20828 Contact with and (suspected) exposure to other viral communicable diseases: Secondary | ICD-10-CM | POA: Diagnosis present

## 2019-06-04 DIAGNOSIS — J45909 Unspecified asthma, uncomplicated: Secondary | ICD-10-CM | POA: Diagnosis present

## 2019-06-04 DIAGNOSIS — J9601 Acute respiratory failure with hypoxia: Principal | ICD-10-CM | POA: Diagnosis present

## 2019-06-04 DIAGNOSIS — J3800 Paralysis of vocal cords and larynx, unspecified: Secondary | ICD-10-CM

## 2019-06-04 DIAGNOSIS — Z825 Family history of asthma and other chronic lower respiratory diseases: Secondary | ICD-10-CM

## 2019-06-04 DIAGNOSIS — I444 Left anterior fascicular block: Secondary | ICD-10-CM | POA: Diagnosis present

## 2019-06-04 DIAGNOSIS — I2510 Atherosclerotic heart disease of native coronary artery without angina pectoris: Secondary | ICD-10-CM | POA: Diagnosis present

## 2019-06-04 DIAGNOSIS — Z720 Tobacco use: Secondary | ICD-10-CM | POA: Diagnosis present

## 2019-06-04 DIAGNOSIS — Z7982 Long term (current) use of aspirin: Secondary | ICD-10-CM

## 2019-06-04 DIAGNOSIS — R0603 Acute respiratory distress: Secondary | ICD-10-CM | POA: Diagnosis present

## 2019-06-04 DIAGNOSIS — Z6841 Body Mass Index (BMI) 40.0 and over, adult: Secondary | ICD-10-CM

## 2019-06-04 DIAGNOSIS — J206 Acute bronchitis due to rhinovirus: Secondary | ICD-10-CM | POA: Diagnosis present

## 2019-06-04 DIAGNOSIS — E876 Hypokalemia: Secondary | ICD-10-CM | POA: Diagnosis present

## 2019-06-04 DIAGNOSIS — Z9049 Acquired absence of other specified parts of digestive tract: Secondary | ICD-10-CM

## 2019-06-04 DIAGNOSIS — I10 Essential (primary) hypertension: Secondary | ICD-10-CM | POA: Diagnosis present

## 2019-06-04 DIAGNOSIS — J9600 Acute respiratory failure, unspecified whether with hypoxia or hypercapnia: Secondary | ICD-10-CM | POA: Diagnosis present

## 2019-06-04 DIAGNOSIS — J440 Chronic obstructive pulmonary disease with acute lower respiratory infection: Secondary | ICD-10-CM | POA: Diagnosis present

## 2019-06-04 DIAGNOSIS — Z955 Presence of coronary angioplasty implant and graft: Secondary | ICD-10-CM

## 2019-06-04 DIAGNOSIS — I4581 Long QT syndrome: Secondary | ICD-10-CM | POA: Diagnosis present

## 2019-06-04 DIAGNOSIS — J3801 Paralysis of vocal cords and larynx, unilateral: Secondary | ICD-10-CM | POA: Diagnosis present

## 2019-06-04 DIAGNOSIS — F17210 Nicotine dependence, cigarettes, uncomplicated: Secondary | ICD-10-CM | POA: Diagnosis present

## 2019-06-04 DIAGNOSIS — I160 Hypertensive urgency: Secondary | ICD-10-CM | POA: Diagnosis present

## 2019-06-04 DIAGNOSIS — Z79899 Other long term (current) drug therapy: Secondary | ICD-10-CM

## 2019-06-04 DIAGNOSIS — K760 Fatty (change of) liver, not elsewhere classified: Secondary | ICD-10-CM | POA: Diagnosis present

## 2019-06-04 DIAGNOSIS — Z96659 Presence of unspecified artificial knee joint: Secondary | ICD-10-CM | POA: Diagnosis present

## 2019-06-04 DIAGNOSIS — Z885 Allergy status to narcotic agent status: Secondary | ICD-10-CM

## 2019-06-04 DIAGNOSIS — J441 Chronic obstructive pulmonary disease with (acute) exacerbation: Secondary | ICD-10-CM | POA: Diagnosis present

## 2019-06-04 DIAGNOSIS — I252 Old myocardial infarction: Secondary | ICD-10-CM

## 2019-06-04 DIAGNOSIS — G4733 Obstructive sleep apnea (adult) (pediatric): Secondary | ICD-10-CM | POA: Diagnosis present

## 2019-06-04 LAB — COMPREHENSIVE METABOLIC PANEL
ALT: 13 U/L (ref 0–44)
AST: 15 U/L (ref 15–41)
Albumin: 3.5 g/dL (ref 3.5–5.0)
Alkaline Phosphatase: 100 U/L (ref 38–126)
Anion gap: 10 (ref 5–15)
BUN: 10 mg/dL (ref 6–20)
CO2: 26 mmol/L (ref 22–32)
Calcium: 8.7 mg/dL — ABNORMAL LOW (ref 8.9–10.3)
Chloride: 105 mmol/L (ref 98–111)
Creatinine, Ser: 0.7 mg/dL (ref 0.44–1.00)
GFR calc Af Amer: >60 mL/min (ref >60)
GFR calc non Af Amer: >60 mL/min (ref >60)
Glucose, Bld: 114 mg/dL — ABNORMAL HIGH (ref 70–99)
Potassium: 3.1 mmol/L — ABNORMAL LOW (ref 3.5–5.1)
Sodium: 141 mmol/L (ref 135–145)
Total Bilirubin: 0.4 mg/dL (ref 0.3–1.2)
Total Protein: 7.4 g/dL (ref 6.5–8.1)

## 2019-06-04 LAB — CBC WITH DIFFERENTIAL/PLATELET
Abs Immature Granulocytes: 0.04 10*3/uL (ref 0.00–0.07)
Basophils Absolute: 0.1 10*3/uL (ref 0.0–0.1)
Basophils Relative: 0 %
Eosinophils Absolute: 0.1 10*3/uL (ref 0.0–0.5)
Eosinophils Relative: 1 %
HCT: 37.7 % (ref 36.0–46.0)
Hemoglobin: 12.1 g/dL (ref 12.0–15.0)
Immature Granulocytes: 0 %
Lymphocytes Relative: 19 %
Lymphs Abs: 2.6 10*3/uL (ref 0.7–4.0)
MCH: 29.3 pg (ref 26.0–34.0)
MCHC: 32.1 g/dL (ref 30.0–36.0)
MCV: 91.3 fL (ref 80.0–100.0)
Monocytes Absolute: 1.2 10*3/uL — ABNORMAL HIGH (ref 0.1–1.0)
Monocytes Relative: 9 %
Neutro Abs: 9.6 10*3/uL — ABNORMAL HIGH (ref 1.7–7.7)
Neutrophils Relative %: 71 %
Platelets: 376 10*3/uL (ref 150–400)
RBC: 4.13 MIL/uL (ref 3.87–5.11)
RDW: 13.2 % (ref 11.5–15.5)
WBC: 13.6 10*3/uL — ABNORMAL HIGH (ref 4.0–10.5)
nRBC: 0 % (ref 0.0–0.2)

## 2019-06-04 LAB — I-STAT BETA HCG BLOOD, ED (NOT ORDERABLE): I-stat hCG, quantitative: <5 m[IU]/mL (ref <5)

## 2019-06-04 LAB — LACTATE DEHYDROGENASE: LDH: 176 U/L (ref 98–192)

## 2019-06-04 LAB — TRIGLYCERIDES: Triglycerides: 67 mg/dL (ref <150)

## 2019-06-04 LAB — LACTIC ACID, PLASMA: Lactic Acid, Venous: 1.5 mmol/L (ref 0.5–1.9)

## 2019-06-04 MED ORDER — ACETAMINOPHEN 500 MG PO TABS
1000.0000 mg | ORAL_TABLET | Freq: Once | ORAL | Status: AC
  Administered 2019-06-04: 1000 mg via ORAL
  Filled 2019-06-04: qty 2

## 2019-06-04 MED ORDER — AEROCHAMBER Z-STAT PLUS/MEDIUM MISC
1.0000 | Freq: Once | Status: AC
  Administered 2019-06-04: 1
  Filled 2019-06-04: qty 1

## 2019-06-04 MED ORDER — ALBUTEROL SULFATE (2.5 MG/3ML) 0.083% IN NEBU
5.0000 mg | INHALATION_SOLUTION | Freq: Once | RESPIRATORY_TRACT | Status: DC
  Filled 2019-06-04 (×2): qty 6

## 2019-06-04 MED ORDER — IPRATROPIUM BROMIDE HFA 17 MCG/ACT IN AERS
2.0000 | INHALATION_SPRAY | RESPIRATORY_TRACT | Status: AC
  Administered 2019-06-04 – 2019-06-05 (×2): 2 via RESPIRATORY_TRACT
  Filled 2019-06-04: qty 12.9

## 2019-06-04 MED ORDER — ALBUTEROL SULFATE HFA 108 (90 BASE) MCG/ACT IN AERS
INHALATION_SPRAY | RESPIRATORY_TRACT | Status: AC
  Administered 2019-06-04: 6
  Filled 2019-06-04: qty 6.7

## 2019-06-04 MED ORDER — DEXAMETHASONE SODIUM PHOSPHATE 10 MG/ML IJ SOLN
10.0000 mg | Freq: Once | INTRAMUSCULAR | Status: AC
  Administered 2019-06-04: 10 mg via INTRAVENOUS
  Filled 2019-06-04: qty 1

## 2019-06-04 NOTE — ED Triage Notes (Signed)
Pt arrived very SOB  Mild retraction noted room air stat 91% onset 1 day ago and has progressively worsened

## 2019-06-04 NOTE — ED Provider Notes (Signed)
[facility]-EMERGENCY DEPT Provider Note   CSN: [id number] Arrival date & time: 06/04/19  2016     History   Chief Complaint Chief Complaint  Patient presents with   Shortness of Breath    HPI Devin Foskey is a 43 y.o. female.     43 year old female with a history of hypertension, CAD and ACS s/p PCI x 2, morbid obesity presents to the emergency department for shortness of breath.  She states that she had preceding nasal congestion and postnasal drip as well as sinus pressure.  Saw her doctor for this a few days ago and was started on amoxicillin.  Began experiencing cough yesterday.  Cough is harsh, nonproductive.  She feels as though she has phlegm in her chest that she is unable to cough up.  Began noticing increased shortness of breath last night.  This has remained constant and become worse.  Her shortness of breath is aggravated with any degree of exertion.  She has no history of chronic oxygen requirement.  Reports mild pleuritic discomfort with coughing only.  States that she is not experiencing symptoms similar to when she had her prior MI.  No known sick contacts, syncope, leg swelling, vomiting, loss of smell or taste.  Given an albuterol inhaler in triage which the patient took 5 puffs of.  Feels this did not help her symptoms significantly.  Has not had any asthma exacerbation in greater than 15 years.  The history is provided by the patient. No language interpreter was used.  Shortness of Breath   Past Medical History:  Diagnosis Date   Hypertension    MI (myocardial infarction) (HCC)    Pyloric stenosis     Patient Active Problem List   Diagnosis Date Noted   Solitary pulmonary nodule 10/16/2016   Dyspnea on exertion 10/16/2016   Upper airway cough syndrome 09/18/2016   Essential hypertension 09/18/2016   Morbid (severe) obesity due to excess calories (HCC) 09/18/2016   Cigarette smoker 09/18/2016    Past Surgical History:    Procedure Laterality Date   APPENDECTOMY     cervix     nov[place]ore   CHOLECYSTECTOMY     MEDIAL PARTIAL KNEE REPLACEMENT       OB History   No obstetric history on file.      Home Medications    Prior to Admission medications   Medication Sig Start Date End Date Taking? Authorizing Provider  amoxicillin (AMOXIL) 500 MG capsule Take 500 mg by mouth 2 (two) times daily. 06/01/19  Yes [provider]  aspirin-acetaminophen-caffeine (EXCEDRIN MIGRAINE) [phone] MG tablet Take 2 tablets by mouth every 6 (six) hours as needed for headache.    Yes [provider]  cholecalciferol (VITAMIN D3) 25 MCG (1000 UT) tablet Take by mouth. 01/01/19  Yes [provider]  lisinopril (ZESTRIL) 40 MG tablet Take 40 mg by mouth daily.  02/20/19  Yes [provider]  metoprolol succinate (TOPROL-XL) 100 MG 24 hr tablet Take 100 mg by mouth daily.  02/20/19  Yes [provider]  rosuvastatin (CRESTOR) 40 MG tablet Take 40 mg by mouth daily.   Yes [provider]  aspirin EC 81 MG tablet Take 81 mg by mouth daily.    [provider]  CLARITIN 10 MG tablet Take 10 mg by mouth at bedtime. 06/01/19   [provider]  lisinopril (PRINIVIL,ZESTRIL) 10 MG tablet Take 1 tablet (10 mg total) by mouth daily. 07/10/18 10/15/18  Liberty Handy,  PA-C  metoprolol succinate (TOPROL-XL) 50 MG 24 hr tablet Take 1 tablet (50 mg total) by mouth daily. Take with or immediately following a meal. Patient not taking: Reported on 06/05/2019 07/10/18 10/15/18  Liberty HandyGibbons, Claudia J, PA-C    Family History Family History  Problem Relation Age of Onset   Allergies Mother    Asthma Mother    Asthma Father    Stomach cancer Maternal Grandmother     Social History Social History   Tobacco Use   Smoking status: Current Every Day Smoker    Packs/day: 0.50    Years: 20.00    Pack years: 10.00    Types: Cigarettes   Smokeless tobacco: Never Used   Substance Use Topics   Alcohol use: No   Drug use: No     Allergies   Demerol  [meperidine hcl], Magnesium sulfate, Meperidine, Gadolinium derivatives, and Pirbuterol   Review of Systems Review of Systems  Respiratory: Positive for shortness of breath.   Ten systems reviewed and are negative for acute change, except as noted in the HPI.     Physical Exam Updated Vital Signs BP 121/66 (BP Location: Right Wrist)    Pulse (!) 105    Temp 99.2 F (37.3 C) (Oral)    Resp (!) 25    Ht 5' 9.5" (1.765 m)    Wt 132.9 kg    SpO2 96%    BMI 42.65 kg/m   Physical Exam Vitals signs and nursing note reviewed.  Constitutional:      General: She is not in acute distress.    Appearance: She is well-developed. She is not diaphoretic.     Comments: Obese AA female.  HENT:     Head: Normocephalic and atraumatic.  Eyes:     General: No scleral icterus.    Conjunctiva/sclera: Conjunctivae normal.  Neck:     Musculoskeletal: Normal range of motion.  Cardiovascular:     Rate and Rhythm: Regular rhythm. Tachycardia present.     Pulses: Normal pulses.  Pulmonary:     Effort: Tachypnea, accessory muscle usage and retractions present.     Breath sounds: No stridor. Wheezing present.     Comments: Speaking in truncated sentences. Mild use of accessory muscles. Visibly dyspneic with tachypnea. SpO2 is 98% on RA. Diffuse expiratory wheeze throughout.  Musculoskeletal: Normal range of motion.     Comments: No BLE edema.  Skin:    General: Skin is warm and dry.     Coloration: Skin is not pale.     Findings: No erythema or rash.  Neurological:     General: No focal deficit present.     Mental Status: She is alert and oriented to person, place, and time.     Coordination: Coordination normal.     Comments: GCS 15.  Answers questions appropriately and follows commands.  Moving all extremities spontaneously.  Psychiatric:        Behavior: Behavior normal.      ED Treatments / Results   Labs (all labs ordered are listed, but only abnormal results are displayed) Labs Reviewed  CBC WITH DIFFERENTIAL/PLATELET - Abnormal; Notable for the following components:      Result Value   WBC 13.6 (*)    Neutro Abs 9.6 (*)    Monocytes Absolute 1.2 (*)    All other components within normal limits  COMPREHENSIVE METABOLIC PANEL - Abnormal; Notable for the following components:   Potassium 3.1 (*)    Glucose, Bld 114 (*)  Calcium 8.7 (*)    All other components within normal limits  D-DIMER, QUANTITATIVE (NOT AT [facility]) - Abnormal; Notable for the following components:   D-Dimer, Quant 2.59 (*)    All other components within normal limits  FIBRINOGEN - Abnormal; Notable for the following components:   Fibrinogen 595 (*)    All other components within normal limits  C-REACTIVE PROTEIN - Abnormal; Notable for the following components:   CRP 4.7 (*)    All other components within normal limits  SARS CORONAVIRUS 2 BY RT PCR (HOSPITAL ORDER, PERFORMED IN [facility])  CULTURE, BLOOD (ROUTINE X 2)  CULTURE, BLOOD (ROUTINE X 2)  RESPIRATORY PANEL BY PCR  LACTIC ACID, PLASMA  PROCALCITONIN  LACTATE DEHYDROGENASE  FERRITIN  TRIGLYCERIDES  I-STAT BETA HCG BLOOD, ED ([facility], [facility], [facility] ONLY)  I-STAT BETA HCG BLOOD, ED (NOT ORDERABLE)  TROPONIN I (HIGH SENSITIVITY)    EKG EKG Interpretation  Date/Time:  Thursday June 04 2019 20:26:45 EDT Ventricular Rate:  105 PR Interval:    QRS Duration: 86 QT Interval:  382 QTC Calculation: 503 R Axis:   -75 Text Interpretation: Sinus tachycardia Left anterior fascicular block Prolonged QT interval Baseline wander in lead(s) I III aVL aVF No stemi Confirmed by Alvester Chou [phone]) on 06/05/2019 12:27:11 AM   Radiology Dg Chest 2 View  Result Date: 06/04/2019 CLINICAL DATA:  Shortness of breath EXAM: CHEST - 2 VIEW COMPARISON:  10/15/2018 FINDINGS: The heart size and mediastinal contours are within normal limits. Both lungs  are clear. The visualized skeletal structures are unremarkable. IMPRESSION: No active cardiopulmonary disease. Electronically Signed   By: Deatra Robinson M.D.   On: 06/04/2019 21:06   Ct Angio Chest Pe W And/or Wo Contrast  Result Date: 06/05/2019 CLINICAL DATA:  43 year old female with shortness of breath. Concern for pulmonary embolism. EXAM: CT ANGIOGRAPHY CHEST WITH CONTRAST TECHNIQUE: Multidetector CT imaging of the chest was performed using the standard protocol during bolus administration of intravenous contrast. Multiplanar CT image reconstructions and MIPs were obtained to evaluate the vascular anatomy. CONTRAST:  OMNIPAQUE IOHEXOL 350 MG/ML SOLN COMPARISON:  Chest radiograph dated 06/04/2019 and CT dated 06/01/2018. FINDINGS: Evaluation is limited due to patient's body habitus and respiratory motion artifact. Evaluation is also limited due to suboptimal opacification of the pulmonary arteries and timing of the contrast. Cardiovascular: Borderline cardiomegaly. No pericardial effusion. The thoracic aorta is unremarkable. The visualized origins of the great vessels of the aortic arch appear patent. Evaluation of the pulmonary arteries is very limited, almost nondiagnostic due to the factors above. No large or saddle embolus identified in the pulmonary trunk or main pulmonary arteries. If there is high clinical concern for acute PE, V/Q scan may provide better evaluation. Mediastinum/Nodes: There is no hilar or mediastinal adenopathy. The esophagus is grossly unremarkable. No mediastinal fluid collection. Lungs/Pleura: Bilateral lower lobe subsegmental and linear atelectasis noted. No lobar consolidation, pleural effusion, or pneumothorax. The central airways are patent. Upper Abdomen: No acute abnormality. Musculoskeletal: No chest wall abnormality. No acute or significant osseous findings. Review of the MIP images confirms the above findings. IMPRESSION: 1. No definite CT evidence of large central  pulmonary artery embolus. Evaluation is very limited, almost nondiagnostic, due to patient's body habitus and respiratory motion artifact. If there is high clinical concern for acute PE, V/Q scan may provide better evaluation. 2. Bilateral lower lobe subsegmental and linear atelectasis. Electronically Signed   By: Elgie Collard M.D.   On: 06/05/2019 03:12  Procedures .Critical Care Performed by: Antonietta Breach, PA-C Authorized by: Antonietta Breach, PA-C   Critical care provider statement:    Critical care time (minutes):  45   Critical care was necessary to treat or prevent imminent or life-threatening deterioration of the following conditions:  Respiratory failure   Critical care was time spent personally by me on the following activities:  Discussions with consultants, evaluation of patient's response to treatment, examination of patient, ordering and performing treatments and interventions, ordering and review of laboratory studies, ordering and review of radiographic studies, pulse oximetry, re-evaluation of patient's condition, obtaining history from patient or surrogate and review of old charts   (including critical care time)  Medications Ordered in ED Medications  albuterol (PROVENTIL) (2.5 MG/3ML) 0.083% nebulizer solution 5 mg (0 mg Nebulization Hold 06/04/19 2246)  ipratropium (ATROVENT HFA) inhaler 2 puff (2 puffs Inhalation Not Given 06/05/19 0044)  potassium chloride SA (KLOR-CON) CR tablet 40 mEq (40 mEq Oral Not Given 06/05/19 0044)  albuterol (PROVENTIL,VENTOLIN) solution continuous neb (0 mg Nebulization Stopped 06/05/19 0209)  albuterol (VENTOLIN HFA) 108 (90 Base) MCG/ACT inhaler (6 puffs  Given 06/04/19 2321)  aerochamber Z-Stat Plus/medium 1 each (1 each Other Given 06/04/19 2322)  dexamethasone (DECADRON) injection 10 mg (10 mg Intravenous Given 06/04/19 2323)  acetaminophen (TYLENOL) tablet 1,000 mg (1,000 mg Oral Given 06/04/19 2325)  potassium chloride 10 mEq in 100  mL IVPB (0 mEq Intravenous Stopped 06/05/19 0304)  sodium chloride (PF) 0.9 % injection (  Given by Other 06/05/19 0243)  iohexol (OMNIPAQUE) 350 MG/ML injection 100 mL (100 mLs Intravenous Contrast Given 06/05/19 0243)     Initial Impression / Assessment and Plan / ED Course  I have reviewed the triage vital signs and the nursing notes.  Pertinent labs & imaging results that were available during my care of the patient were reviewed by me and considered in my medical decision making (see chart for details).        43 year old female to be admitted to the hospital for acute respiratory failure with hypoxia.  Noted to be hypoxic on room air in triage.  Diffuse expiratory wheezing on initial exam.  Patient with low-grade temperature, tachycardia.  Suspect that persistence of tachycardia is related to continued use of albuterol.  Her oxygen saturations have been stable on 2 L supplemental oxygen via nasal cannula.  Persistently tachypneic without acute distress.  Her initial evaluation in the ED was concerning for coronavirus, but her Covid test today is negative.  Given elevated D-dimer, CTA was performed to assess for PE.  Unfortunately, imaging was nondiagnostic given poor timing of contrast and patient's body habitus.  She will require admission for ongoing management of shortness of breath.  As there is no evidence of pneumonia/infiltrate on CT scan, viral process favored as cause of bronchospasm.  Case discussed with Dr. Alcario Drought who will admit.   Final Clinical Impressions(s) / ED Diagnoses   Final diagnoses:  Acute respiratory failure with hypoxia [facility])  Hypokalemia    ED Discharge Orders    None       Antonietta Breach, PA-C 06/05/19 1191    Wyvonnia Dusky, MD 06/05/19 [phone]

## 2019-06-05 ENCOUNTER — Emergency Department (HOSPITAL_COMMUNITY): Payer: Medicaid - Out of State

## 2019-06-05 ENCOUNTER — Encounter (HOSPITAL_COMMUNITY): Payer: Self-pay

## 2019-06-05 DIAGNOSIS — J441 Chronic obstructive pulmonary disease with (acute) exacerbation: Secondary | ICD-10-CM | POA: Diagnosis present

## 2019-06-05 DIAGNOSIS — J206 Acute bronchitis due to rhinovirus: Secondary | ICD-10-CM | POA: Diagnosis present

## 2019-06-05 DIAGNOSIS — Z825 Family history of asthma and other chronic lower respiratory diseases: Secondary | ICD-10-CM | POA: Diagnosis not present

## 2019-06-05 DIAGNOSIS — Z96659 Presence of unspecified artificial knee joint: Secondary | ICD-10-CM | POA: Diagnosis present

## 2019-06-05 DIAGNOSIS — J45901 Unspecified asthma with (acute) exacerbation: Secondary | ICD-10-CM | POA: Diagnosis present

## 2019-06-05 DIAGNOSIS — J9601 Acute respiratory failure with hypoxia: Secondary | ICD-10-CM | POA: Diagnosis present

## 2019-06-05 DIAGNOSIS — J3801 Paralysis of vocal cords and larynx, unilateral: Secondary | ICD-10-CM | POA: Diagnosis present

## 2019-06-05 DIAGNOSIS — G4733 Obstructive sleep apnea (adult) (pediatric): Secondary | ICD-10-CM | POA: Diagnosis present

## 2019-06-05 DIAGNOSIS — I10 Essential (primary) hypertension: Secondary | ICD-10-CM

## 2019-06-05 DIAGNOSIS — J9600 Acute respiratory failure, unspecified whether with hypoxia or hypercapnia: Secondary | ICD-10-CM | POA: Diagnosis present

## 2019-06-05 DIAGNOSIS — Z955 Presence of coronary angioplasty implant and graft: Secondary | ICD-10-CM | POA: Diagnosis not present

## 2019-06-05 DIAGNOSIS — J440 Chronic obstructive pulmonary disease with acute lower respiratory infection: Secondary | ICD-10-CM | POA: Diagnosis present

## 2019-06-05 DIAGNOSIS — I252 Old myocardial infarction: Secondary | ICD-10-CM | POA: Diagnosis not present

## 2019-06-05 DIAGNOSIS — J45909 Unspecified asthma, uncomplicated: Secondary | ICD-10-CM | POA: Diagnosis present

## 2019-06-05 DIAGNOSIS — Z79899 Other long term (current) drug therapy: Secondary | ICD-10-CM | POA: Diagnosis not present

## 2019-06-05 DIAGNOSIS — I2510 Atherosclerotic heart disease of native coronary artery without angina pectoris: Secondary | ICD-10-CM | POA: Diagnosis present

## 2019-06-05 DIAGNOSIS — Z7982 Long term (current) use of aspirin: Secondary | ICD-10-CM | POA: Diagnosis not present

## 2019-06-05 DIAGNOSIS — R0603 Acute respiratory distress: Secondary | ICD-10-CM | POA: Diagnosis not present

## 2019-06-05 DIAGNOSIS — E876 Hypokalemia: Secondary | ICD-10-CM | POA: Diagnosis present

## 2019-06-05 DIAGNOSIS — Z6841 Body Mass Index (BMI) 40.0 and over, adult: Secondary | ICD-10-CM | POA: Diagnosis not present

## 2019-06-05 DIAGNOSIS — J209 Acute bronchitis, unspecified: Secondary | ICD-10-CM

## 2019-06-05 DIAGNOSIS — F17210 Nicotine dependence, cigarettes, uncomplicated: Secondary | ICD-10-CM | POA: Diagnosis present

## 2019-06-05 DIAGNOSIS — I444 Left anterior fascicular block: Secondary | ICD-10-CM | POA: Diagnosis present

## 2019-06-05 DIAGNOSIS — R0600 Dyspnea, unspecified: Secondary | ICD-10-CM | POA: Diagnosis not present

## 2019-06-05 DIAGNOSIS — I4581 Long QT syndrome: Secondary | ICD-10-CM | POA: Diagnosis present

## 2019-06-05 DIAGNOSIS — Z20828 Contact with and (suspected) exposure to other viral communicable diseases: Secondary | ICD-10-CM | POA: Diagnosis present

## 2019-06-05 DIAGNOSIS — K760 Fatty (change of) liver, not elsewhere classified: Secondary | ICD-10-CM | POA: Diagnosis present

## 2019-06-05 DIAGNOSIS — J3800 Paralysis of vocal cords and larynx, unspecified: Secondary | ICD-10-CM | POA: Diagnosis not present

## 2019-06-05 DIAGNOSIS — R0602 Shortness of breath: Secondary | ICD-10-CM | POA: Diagnosis present

## 2019-06-05 DIAGNOSIS — Z9049 Acquired absence of other specified parts of digestive tract: Secondary | ICD-10-CM | POA: Diagnosis not present

## 2019-06-05 LAB — RESPIRATORY PANEL BY PCR

## 2019-06-05 LAB — BLOOD GAS, ARTERIAL
Acid-Base Excess: 1 mmol/L (ref 0.0–2.0)
Bicarbonate: 25 mmol/L (ref 20.0–28.0)
O2 Saturation: 91.1 %
Patient temperature: 98.6
pCO2 arterial: 39.2 mmHg (ref 32.0–48.0)
pH, Arterial: 7.42 (ref 7.350–7.450)
pO2, Arterial: 61.4 mmHg — ABNORMAL LOW (ref 83.0–108.0)

## 2019-06-05 LAB — HIV ANTIBODY (ROUTINE TESTING W REFLEX): HIV Screen 4th Generation wRfx: NONREACTIVE

## 2019-06-05 LAB — BASIC METABOLIC PANEL
Anion gap: 12 (ref 5–15)
Anion gap: 7 (ref 5–15)
BUN: 11 mg/dL (ref 6–20)
BUN: 9 mg/dL (ref 6–20)
CO2: 24 mmol/L (ref 22–32)
CO2: 22 mmol/L (ref 22–32)
Calcium: 8.6 mg/dL — ABNORMAL LOW (ref 8.9–10.3)
Calcium: 8.3 mg/dL — ABNORMAL LOW (ref 8.9–10.3)
Chloride: 103 mmol/L (ref 98–111)
Chloride: 113 mmol/L — ABNORMAL HIGH (ref 98–111)
Creatinine, Ser: 0.83 mg/dL (ref 0.44–1.00)
Creatinine, Ser: 0.53 mg/dL (ref 0.44–1.00)
GFR calc Af Amer: >60 mL/min (ref >60)
GFR calc Af Amer: >60 mL/min (ref >60)
GFR calc non Af Amer: >60 mL/min (ref >60)
GFR calc non Af Amer: >60 mL/min (ref >60)
Glucose, Bld: 272 mg/dL — ABNORMAL HIGH (ref 70–99)
Glucose, Bld: 138 mg/dL — ABNORMAL HIGH (ref 70–99)
Potassium: 2.6 mmol/L — CL (ref 3.5–5.1)
Potassium: 3.8 mmol/L (ref 3.5–5.1)
Sodium: 139 mmol/L (ref 135–145)
Sodium: 142 mmol/L (ref 135–145)

## 2019-06-05 LAB — SARS CORONAVIRUS 2 BY RT PCR (HOSPITAL ORDER, PERFORMED IN ~~LOC~~ HOSPITAL LAB)
SARS Coronavirus 2: NEGATIVE
SARS Coronavirus 2: NEGATIVE

## 2019-06-05 LAB — PROCALCITONIN: Procalcitonin: <0.1 ng/mL

## 2019-06-05 LAB — CBC
HCT: 38.3 % (ref 36.0–46.0)
Hemoglobin: 12.4 g/dL (ref 12.0–15.0)
MCH: 30 pg (ref 26.0–34.0)
MCHC: 32.4 g/dL (ref 30.0–36.0)
MCV: 92.5 fL (ref 80.0–100.0)
Platelets: 372 10*3/uL (ref 150–400)
RBC: 4.14 MIL/uL (ref 3.87–5.11)
RDW: 13.5 % (ref 11.5–15.5)
WBC: 12.7 10*3/uL — ABNORMAL HIGH (ref 4.0–10.5)
nRBC: 0 % (ref 0.0–0.2)

## 2019-06-05 LAB — D-DIMER, QUANTITATIVE: D-Dimer, Quant: 2.59 ug/mL-FEU — ABNORMAL HIGH (ref 0.00–0.50)

## 2019-06-05 LAB — GLUCOSE, CAPILLARY: Glucose-Capillary: 160 mg/dL — ABNORMAL HIGH (ref 70–99)

## 2019-06-05 LAB — MAGNESIUM: Magnesium: 1.9 mg/dL (ref 1.7–2.4)

## 2019-06-05 LAB — FERRITIN: Ferritin: 46 ng/mL (ref 11–307)

## 2019-06-05 LAB — C-REACTIVE PROTEIN: CRP: 4.7 mg/dL — ABNORMAL HIGH (ref <1.0)

## 2019-06-05 LAB — FIBRINOGEN: Fibrinogen: 595 mg/dL — ABNORMAL HIGH (ref 210–475)

## 2019-06-05 LAB — TROPONIN I (HIGH SENSITIVITY)
Troponin I (High Sensitivity): 18 ng/L — ABNORMAL HIGH (ref <18)
Troponin I (High Sensitivity): 20 ng/L — ABNORMAL HIGH (ref <18)

## 2019-06-05 LAB — CBG MONITORING, ED: Glucose-Capillary: 164 mg/dL — ABNORMAL HIGH (ref 70–99)

## 2019-06-05 MED ORDER — FLUTICASONE PROPIONATE 50 MCG/ACT NA SUSP
2.0000 | Freq: Every day | NASAL | Status: DC
  Administered 2019-06-07: 2 via NASAL
  Filled 2019-06-05: qty 16

## 2019-06-05 MED ORDER — ALBUTEROL SULFATE (2.5 MG/3ML) 0.083% IN NEBU
2.5000 mg | INHALATION_SOLUTION | RESPIRATORY_TRACT | Status: DC | PRN
  Administered 2019-06-05 – 2019-06-07 (×2): 2.5 mg via RESPIRATORY_TRACT
  Filled 2019-06-05 (×2): qty 3

## 2019-06-05 MED ORDER — SODIUM CHLORIDE 0.9 % IV SOLN
1.0000 g | Freq: Every day | INTRAVENOUS | Status: DC
  Administered 2019-06-05 – 2019-06-06 (×2): 1 g via INTRAVENOUS
  Filled 2019-06-05: qty 10
  Filled 2019-06-05: qty 1

## 2019-06-05 MED ORDER — METOPROLOL SUCCINATE ER 50 MG PO TB24
100.0000 mg | ORAL_TABLET | Freq: Every day | ORAL | Status: DC
  Administered 2019-06-05 – 2019-06-08 (×4): 100 mg via ORAL
  Filled 2019-06-05: qty 4
  Filled 2019-06-05: qty 1
  Filled 2019-06-05: qty 2
  Filled 2019-06-05: qty 4

## 2019-06-05 MED ORDER — SODIUM CHLORIDE (PF) 0.9 % IJ SOLN
INTRAMUSCULAR | Status: AC
  Administered 2019-06-05: 03:00:00
  Filled 2019-06-05: qty 50

## 2019-06-05 MED ORDER — LORATADINE 10 MG PO TABS
10.0000 mg | ORAL_TABLET | Freq: Every day | ORAL | Status: DC
  Administered 2019-06-06 – 2019-06-07 (×2): 10 mg via ORAL
  Filled 2019-06-05 (×3): qty 1

## 2019-06-05 MED ORDER — PREDNISONE 20 MG PO TABS
40.0000 mg | ORAL_TABLET | Freq: Every day | ORAL | Status: DC
  Administered 2019-06-05: 40 mg via ORAL
  Filled 2019-06-05: qty 2

## 2019-06-05 MED ORDER — IOHEXOL 350 MG/ML SOLN
100.0000 mL | Freq: Once | INTRAVENOUS | Status: AC | PRN
  Administered 2019-06-05: 100 mL via INTRAVENOUS

## 2019-06-05 MED ORDER — HYDRALAZINE HCL 20 MG/ML IJ SOLN
10.0000 mg | Freq: Once | INTRAMUSCULAR | Status: AC
  Administered 2019-06-05: 21:00:00 10 mg via INTRAVENOUS
  Filled 2019-06-05: qty 1

## 2019-06-05 MED ORDER — METHYLPREDNISOLONE SODIUM SUCC 125 MG IJ SOLR
60.0000 mg | Freq: Four times a day (QID) | INTRAMUSCULAR | Status: DC
  Administered 2019-06-05 – 2019-06-06 (×2): 60 mg via INTRAVENOUS
  Filled 2019-06-05 (×3): qty 2

## 2019-06-05 MED ORDER — ENOXAPARIN SODIUM 40 MG/0.4ML ~~LOC~~ SOLN
40.0000 mg | Freq: Every day | SUBCUTANEOUS | Status: DC
  Filled 2019-06-05: qty 0.4

## 2019-06-05 MED ORDER — MOMETASONE FURO-FORMOTEROL FUM 200-5 MCG/ACT IN AERO: 1.0000 | INHALATION_SPRAY | Freq: Two times a day (BID) | RESPIRATORY_TRACT | Status: DC

## 2019-06-05 MED ORDER — LISINOPRIL 20 MG PO TABS: 40.0000 mg | ORAL_TABLET | Freq: Every day | ORAL | Status: DC

## 2019-06-05 MED ORDER — ALBUTEROL (5 MG/ML) CONTINUOUS INHALATION SOLN
10.0000 mg | INHALATION_SOLUTION | RESPIRATORY_TRACT | Status: AC
  Administered 2019-06-05: 10 mg via RESPIRATORY_TRACT

## 2019-06-05 MED ORDER — ROSUVASTATIN CALCIUM 20 MG PO TABS
40.0000 mg | ORAL_TABLET | Freq: Every day | ORAL | Status: DC
  Administered 2019-06-05 – 2019-06-07 (×3): 40 mg via ORAL
  Filled 2019-06-05 (×2): qty 2
  Filled 2019-06-05: qty 1
  Filled 2019-06-05 (×2): qty 2

## 2019-06-05 MED ORDER — AMLODIPINE BESYLATE 10 MG PO TABS
10.0000 mg | ORAL_TABLET | Freq: Every day | ORAL | Status: DC
  Administered 2019-06-06 – 2019-06-08 (×4): 10 mg via ORAL
  Filled 2019-06-05 (×4): qty 1

## 2019-06-05 MED ORDER — METHYLPREDNISOLONE SODIUM SUCC 125 MG IJ SOLR
INTRAMUSCULAR | Status: AC
  Filled 2019-06-05: qty 2

## 2019-06-05 MED ORDER — ALBUTEROL SULFATE (2.5 MG/3ML) 0.083% IN NEBU
2.5000 mg | INHALATION_SOLUTION | Freq: Three times a day (TID) | RESPIRATORY_TRACT | Status: DC
  Administered 2019-06-05 – 2019-06-06 (×3): 2.5 mg via RESPIRATORY_TRACT
  Filled 2019-06-05 (×3): qty 3

## 2019-06-05 MED ORDER — UMECLIDINIUM BROMIDE 62.5 MCG/INH IN AEPB
1.0000 | INHALATION_SPRAY | Freq: Every day | RESPIRATORY_TRACT | Status: DC
  Administered 2019-06-06 – 2019-06-07 (×2): 1 via RESPIRATORY_TRACT
  Filled 2019-06-05: qty 7

## 2019-06-05 MED ORDER — METHYLPREDNISOLONE SODIUM SUCC 125 MG IJ SOLR
125.0000 mg | Freq: Four times a day (QID) | INTRAMUSCULAR | Status: DC
  Administered 2019-06-05: 125 mg via INTRAVENOUS

## 2019-06-05 MED ORDER — ACETAMINOPHEN 500 MG PO TABS
1000.0000 mg | ORAL_TABLET | Freq: Three times a day (TID) | ORAL | Status: DC | PRN
  Administered 2019-06-05 – 2019-06-06 (×2): 1000 mg via ORAL
  Filled 2019-06-05 (×2): qty 2

## 2019-06-05 MED ORDER — PANTOPRAZOLE SODIUM 40 MG PO TBEC
40.0000 mg | DELAYED_RELEASE_TABLET | Freq: Every day | ORAL | Status: DC
  Administered 2019-06-05 – 2019-06-08 (×3): 40 mg via ORAL
  Filled 2019-06-05 (×5): qty 1

## 2019-06-05 MED ORDER — ENOXAPARIN SODIUM 60 MG/0.6ML ~~LOC~~ SOLN
60.0000 mg | Freq: Every day | SUBCUTANEOUS | Status: DC
  Administered 2019-06-06 – 2019-06-07 (×2): 60 mg via SUBCUTANEOUS
  Filled 2019-06-05 (×3): qty 0.6

## 2019-06-05 MED ORDER — POTASSIUM CHLORIDE CRYS ER 20 MEQ PO TBCR
40.0000 meq | EXTENDED_RELEASE_TABLET | Freq: Once | ORAL | Status: DC
  Filled 2019-06-05: qty 2

## 2019-06-05 MED ORDER — BUDESONIDE 0.5 MG/2ML IN SUSP
0.5000 mg | Freq: Two times a day (BID) | RESPIRATORY_TRACT | Status: DC
  Administered 2019-06-05 – 2019-06-06 (×2): 0.5 mg via RESPIRATORY_TRACT
  Filled 2019-06-05 (×2): qty 2

## 2019-06-05 MED ORDER — POTASSIUM CHLORIDE 10 MEQ/100ML IV SOLN
10.0000 meq | INTRAVENOUS | Status: AC
  Administered 2019-06-05 (×2): 10 meq via INTRAVENOUS
  Filled 2019-06-05 (×2): qty 100

## 2019-06-05 MED ORDER — HYDRALAZINE HCL 20 MG/ML IJ SOLN
10.0000 mg | Freq: Four times a day (QID) | INTRAMUSCULAR | Status: DC | PRN
  Administered 2019-06-06: 10 mg via INTRAVENOUS
  Filled 2019-06-05: qty 1

## 2019-06-05 MED ORDER — MONTELUKAST SODIUM 10 MG PO TABS
10.0000 mg | ORAL_TABLET | Freq: Every day | ORAL | Status: DC
  Administered 2019-06-06 – 2019-06-07 (×3): 10 mg via ORAL
  Filled 2019-06-05 (×3): qty 1

## 2019-06-05 MED ORDER — ALBUTEROL SULFATE (2.5 MG/3ML) 0.083% IN NEBU
2.5000 mg | INHALATION_SOLUTION | Freq: Four times a day (QID) | RESPIRATORY_TRACT | Status: DC
  Administered 2019-06-05: 2.5 mg via RESPIRATORY_TRACT
  Filled 2019-06-05: qty 3

## 2019-06-05 MED ORDER — LOSARTAN POTASSIUM 50 MG PO TABS
100.0000 mg | ORAL_TABLET | Freq: Every day | ORAL | Status: DC
  Administered 2019-06-05 – 2019-06-08 (×4): 100 mg via ORAL
  Filled 2019-06-05 (×4): qty 2

## 2019-06-05 MED ORDER — POTASSIUM CHLORIDE 10 MEQ/100ML IV SOLN
10.0000 meq | INTRAVENOUS | Status: AC
  Administered 2019-06-05 (×6): 10 meq via INTRAVENOUS
  Filled 2019-06-05 (×7): qty 100

## 2019-06-05 MED ORDER — SODIUM CHLORIDE 0.9 % IV BOLUS
1000.0000 mL | Freq: Once | INTRAVENOUS | Status: AC
  Administered 2019-06-05: 1000 mL via INTRAVENOUS

## 2019-06-05 MED ORDER — ASPIRIN EC 81 MG PO TBEC
81.0000 mg | DELAYED_RELEASE_TABLET | Freq: Every day | ORAL | Status: DC
  Administered 2019-06-05 – 2019-06-08 (×4): 81 mg via ORAL
  Filled 2019-06-05 (×4): qty 1

## 2019-06-05 NOTE — Progress Notes (Addendum)
Patient O2 sats on 2 L [place] 97%. Patient neb administered on RA and post treatment O2 sats 94-95%.

## 2019-06-05 NOTE — Progress Notes (Signed)
Heliox not readily available at [facility]. Per RT manager, transfer to [facility] for heliox tx. PCCM NP made aware. RT will continue to monitor patient.

## 2019-06-05 NOTE — Consult Note (Signed)
   NAME:  Julia Ingram, MRN:  [id number], DOB:  [birth year], LOS: 0 ADMISSION DATE:  06/04/2019, CONSULTATION DATE:  06/05/19 REFERRING MD:  Grandville Silos, CHIEF COMPLAINT:  SOB   Brief History   43 year old woman with hx of HTN, CAD s/p PCI, morbid obesity, OSA p/w URI symptoms that progressed to bronchospasm.  History of present illness   43 year old woman with hx of HTN, CAD s/p PCI, morbid obesity, OSA p/w URI symptoms (L sinus pain sore throat) that progressed to cough, dyspnea and bronchospasm.  Not really improving in ER so PCCM consulted. Patient denies sick contacts.  COVID neg.   Never intubated for breathing.  Is still smoking unfortunately.  Past Medical History  CAD HTN Obesity Metabolic syndrome OSA  Significant Hospital Events   06/05/19 admitted  Consults:  NA  Procedures:  NA  Significant Diagnostic Tests:  CTA: atelectasis  Micro Data:  COVID Neg  Antimicrobials:  Ceftriaxone 10/29>>  Interim history/subjective:  Consulted  Objective   Blood pressure (!) 157/100, pulse 89, temperature 99.2 F (37.3 C), temperature source Oral, resp. rate 20, height 5' 9.5" (1.765 m), weight 132.9 kg, SpO2 94 %.        Intake/Output Summary (Last 24 hours) at 06/05/2019 1254 Last data filed at 06/05/2019 1146 Gross per 24 hour  Intake 1600 ml  Output -  Net 1600 ml   Filed Weights   06/05/19 0048  Weight: 132.9 kg    Examination: GEN: Obese woman in NAD HEENT: cobblestoning of posterior nasal mucosa, malampatti 3 CV: RRR, ext warm PULM: Wheezing over larynx and throughout lungs GI: Soft, +BS EXT: No edema NEURO: moves all 4 ext to command PSYCH: AOx3, good insight SKIN: No rashes   Resolved Hospital Problem list   NA  Assessment & Plan:  # Acute respiratory distress, sounds more viral with sinusitis prodrome.  Not really seeing any signs of COVID.  Eos neg.  Has UACS by clinical history. # Smoking- does not help things # Metabolic syndrome (OSA,  HTN, fatty liver, CAD)  Review of her chart shows she has left vocal cord paralysis evaluated by ENT and recommended for potential laryngoplasty which she refused at that time.  I think this is playing a big role.  Fine for PCU, I do not think she is in danger of intubation but fine to watch there.  Nebs/steroids/ceftriaxone vs. Azithromycin fine for now Add singulair Continue PPI, flonase Switch ACEi to ARB to reduce bradykinin burden Will follow with you  Erskine Emery MD

## 2019-06-05 NOTE — ED Notes (Signed)
Critical Potassium of 2.6. Admitting MD made aware.

## 2019-06-05 NOTE — ED Notes (Signed)
Pt wheeled in wheelchair to restroom able to transfer without assistance.

## 2019-06-05 NOTE — ED Notes (Signed)
Pt ambulatory to restroom without concern  

## 2019-06-05 NOTE — H&P (Signed)
History and Physical    Julia Richtersracey Barkow JYN:[id number] DOB: 1975/12/08 DOA: 06/04/2019  PCP: Patient, No Pcp Per  Patient coming from: Home  I have personally briefly reviewed patient's old medical records in [facility]  Chief Complaint: SOB  HPI: Julia Ingram is a 43 y.o. female with medical history significant of HTN, CAD s/p PCI X2, morbid obesity.  Patient presents to the ED with c/o SOB.  Had preceding nasal congestion and postnasal drip, sinus pressure.  Saw doctor a few days ago for this, prescribed amoxicillin.  Cough onset yesterday, cough is non-productive.  Increasing SOB last night.  This has progressively worsened.  Mild pleuritic discomfort with coughing only.  No CP.  No known sick contacts, syncope, leg swelling, loss of smell or taste.  Has history of "asthma" in the past but this hasnt bothered her for over 15 years she estimates.   ED Course: Tm 100.4, HR 100-110s after albuterol.  Satting 96% on 2L.  D.Dimer 2.59, WBC 13.6k  CTA: 1) non-diagnostic for PE due to contrast timing, though no large / central PE seen 2) No PNA or other lung findings  Procalcitonin negative.  Given albuterol treatments, steroids.   Review of Systems: As per HPI, otherwise all review of systems negative.  Past Medical History:  Diagnosis Date   Hypertension    MI (myocardial infarction) (HCC)    Pyloric stenosis     Past Surgical History:  Procedure Laterality Date   APPENDECTOMY     cervix     novashore   CHOLECYSTECTOMY     MEDIAL PARTIAL KNEE REPLACEMENT       reports that she has been smoking cigarettes. She has a 10.00 pack-year smoking history. She has never used smokeless tobacco. She reports that she does not drink alcohol or use drugs.  Allergies  Allergen Reactions   Demerol  [Meperidine Hcl] Hives   Magnesium Sulfate Anaphylaxis    Per patient on 05/16/15, said her heart stopped when she received this medication.   Meperidine Hives     Gadolinium Derivatives Hives and Nausea And Vomiting   Pirbuterol Other (See Comments)    Headache     Family History  Problem Relation Age of Onset   Allergies Mother    Asthma Mother    Asthma Father    Stomach cancer Maternal Grandmother      Prior to Admission medications   Medication Sig Start Date End Date Taking? Authorizing Provider  amoxicillin (AMOXIL) 500 MG capsule Take 500 mg by mouth 2 (two) times daily. 06/01/19  Yes [provider]  aspirin-acetaminophen-caffeine (EXCEDRIN MIGRAINE) [phone] MG tablet Take 2 tablets by mouth every 6 (six) hours as needed for headache.    Yes [provider]  cholecalciferol (VITAMIN D3) 25 MCG (1000 UT) tablet Take by mouth. 01/01/19  Yes [provider]  lisinopril (ZESTRIL) 40 MG tablet Take 40 mg by mouth daily.  02/20/19  Yes [provider]  metoprolol succinate (TOPROL-XL) 100 MG 24 hr tablet Take 100 mg by mouth daily.  02/20/19  Yes [provider]  rosuvastatin (CRESTOR) 40 MG tablet Take 40 mg by mouth daily.   Yes [provider]  aspirin EC 81 MG tablet Take 81 mg by mouth daily.    [provider]  CLARITIN 10 MG tablet Take 10 mg by mouth at bedtime. 06/01/19   [provider]  lisinopril (PRINIVIL,ZESTRIL) 10 MG tablet Take 1 tablet (10 mg total) by mouth daily. 07/10/18  10/15/18  Liberty Handy, PA-C  metoprolol succinate (TOPROL-XL) 50 MG 24 hr tablet Take 1 tablet (50 mg total) by mouth daily. Take with or immediately following a meal. Patient not taking: Reported on 06/05/2019 07/10/18 10/15/18  Liberty Handy, PA-C    Physical Exam: Vitals:   06/05/19 0130 06/05/19 0207 06/05/19 0310 06/05/19 0354  BP: (!) 153/94 124/86 121/66 135/84  Pulse: (!) 110 (!) 115 (!) 105 (!) 106  Resp: (!) 30 (!) 30 (!) 25 (!) 25  Temp:      TempSrc:      SpO2: 100% 95% 96% 96%  Weight:      Height:        Constitutional: NAD, calm,  comfortable Eyes: PERRL, lids and conjunctivae normal ENMT: Mucous membranes are moist. Posterior pharynx clear of any exudate or lesions.Normal dentition.  Neck: normal, supple, no masses, no thyromegaly Respiratory: Diffuse wheezing, tachypnic without respiratory distress currently. Cardiovascular: Regular rate and rhythm, no murmurs / rubs / gallops. No extremity edema. 2+ pedal pulses. No carotid bruits.  Abdomen: no tenderness, no masses palpated. No hepatosplenomegaly. Bowel sounds positive.  Musculoskeletal: no clubbing / cyanosis. No joint deformity upper and lower extremities. Good ROM, no contractures. Normal muscle tone.  Skin: no rashes, lesions, ulcers. No induration Neurologic: CN 2-12 grossly intact. Sensation intact, DTR normal. Strength 5/5 in all 4.  Psychiatric: Normal judgment and insight. Alert and oriented x 3. Normal mood.    Labs on Admission: I have personally reviewed following labs and imaging studies  CBC: Recent Labs  Lab 06/04/19 2300  WBC 13.6*  NEUTROABS 9.6*  HGB 12.1  HCT 37.7  MCV 91.3  PLT 376   Basic Metabolic Panel: Recent Labs  Lab 06/04/19 2300  NA 141  K 3.1*  CL 105  CO2 26  GLUCOSE 114*  BUN 10  CREATININE 0.70  CALCIUM 8.7*   GFR: Estimated Creatinine Clearance: 131.2 mL/min (by C-G formula based on SCr of 0.7 mg/dL). Liver Function Tests: Recent Labs  Lab 06/04/19 2300  AST 15  ALT 13  ALKPHOS 100  BILITOT 0.4  PROT 7.4  ALBUMIN 3.5   No results for input(s): LIPASE, AMYLASE in the last 168 hours. No results for input(s): AMMONIA in the last 168 hours. Coagulation Profile: No results for input(s): INR, PROTIME in the last 168 hours. Cardiac Enzymes: No results for input(s): CKTOTAL, CKMB, CKMBINDEX, TROPONINI in the last 168 hours. BNP (last 3 results) No results for input(s): PROBNP in the last 8760 hours. HbA1C: No results for input(s): HGBA1C in the last 72 hours. CBG: No results for input(s): GLUCAP in the  last 168 hours. Lipid Profile: Recent Labs    06/04/19 2300  TRIG 67   Thyroid Function Tests: No results for input(s): TSH, T4TOTAL, FREET4, T3FREE, THYROIDAB in the last 72 hours. Anemia Panel: Recent Labs    06/04/19 2300  FERRITIN 46   Urine analysis: No results found for: COLORURINE, APPEARANCEUR, LABSPEC, PHURINE, GLUCOSEU, HGBUR, BILIRUBINUR, KETONESUR, PROTEINUR, UROBILINOGEN, NITRITE, LEUKOCYTESUR  Radiological Exams on Admission: Dg Chest 2 View  Result Date: 06/04/2019 CLINICAL DATA:  Shortness of breath EXAM: CHEST - 2 VIEW COMPARISON:  10/15/2018 FINDINGS: The heart size and mediastinal contours are within normal limits. Both lungs are clear. The visualized skeletal structures are unremarkable. IMPRESSION: No active cardiopulmonary disease. Electronically Signed   By: Deatra Robinson M.D.   On: 06/04/2019 21:06   Ct Angio Chest Pe W And/or Wo Contrast  Result Date: 06/05/2019  CLINICAL DATA:  43 year old female with shortness of breath. Concern for pulmonary embolism. EXAM: CT ANGIOGRAPHY CHEST WITH CONTRAST TECHNIQUE: Multidetector CT imaging of the chest was performed using the standard protocol during bolus administration of intravenous contrast. Multiplanar CT image reconstructions and MIPs were obtained to evaluate the vascular anatomy. CONTRAST:  157mL OMNIPAQUE IOHEXOL 350 MG/ML SOLN COMPARISON:  Chest radiograph dated 06/04/2019 and CT dated 06/01/2018. FINDINGS: Evaluation is limited due to patient's body habitus and respiratory motion artifact. Evaluation is also limited due to suboptimal opacification of the pulmonary arteries and timing of the contrast. Cardiovascular: Borderline cardiomegaly. No pericardial effusion. The thoracic aorta is unremarkable. The visualized origins of the great vessels of the aortic arch appear patent. Evaluation of the pulmonary arteries is very limited, almost nondiagnostic due to the factors above. No large or saddle embolus identified  in the pulmonary trunk or main pulmonary arteries. If there is high clinical concern for acute PE, V/Q scan may provide better evaluation. Mediastinum/Nodes: There is no hilar or mediastinal adenopathy. The esophagus is grossly unremarkable. No mediastinal fluid collection. Lungs/Pleura: Bilateral lower lobe subsegmental and linear atelectasis noted. No lobar consolidation, pleural effusion, or pneumothorax. The central airways are patent. Upper Abdomen: No acute abnormality. Musculoskeletal: No chest wall abnormality. No acute or significant osseous findings. Review of the MIP images confirms the above findings. IMPRESSION: 1. No definite CT evidence of large central pulmonary artery embolus. Evaluation is very limited, almost nondiagnostic, due to patient's body habitus and respiratory motion artifact. If there is high clinical concern for acute PE, V/Q scan may provide better evaluation. 2. Bilateral lower lobe subsegmental and linear atelectasis. Electronically Signed   By: Anner Crete M.D.   On: 06/05/2019 03:12    EKG: Independently reviewed.  Assessment/Plan Principal Problem:   Acute bronchitis Active Problems:   Essential hypertension   Hypokalemia    1. Acute bronchitis - 1. COPD pathway 2. Rocephin empirically 3. COVID neg, but will put in as a PUI on airborne and contact isolation, just-in-case. 1. May want to repeat COVID 4. RVP ordered and pending 5. Adult wheeze protocol 6. PRN albuterol 7. Scheduled LABA, LAMA, and inh steroid 8. Prednisone 1. CBG checks on steroids 2. HTN - 1. Cont home BP meds 3. Hypokalemia - 1. Replace K  DVT prophylaxis: Lovenox Code Status: Full Family Communication: No family in room Disposition Plan: Home after admit Consults called: None Admission status: Place in 53   Carle Dargan, [place] [facility]  How to contact the [facility] Attending or Consulting provider [place] or covering provider during after hours [place], for this  patient?  1. Check the care team in [facility] and look for a) attending/consulting [facility] provider listed and b) the [facility] team listed 2. Log into [email or web address]  Amion Physician Scheduling and messaging for groups and whole hospitals  On call and physician scheduling software for group practices, residents, hospitalists and other medical providers for call, clinic, rotation and shift schedules. On[employer] is a hospital-wide system for scheduling doctors and paging doctors on call. EasyPlot is for scientific plotting and data analysis.  [email or web address]  and use [place]'s universal password to access. If you do not have the password, please contact the hospital operator.  3. Locate the [facility] provider you are looking for under [facility] and page to a number that you can be directly reached. 4. If you still have difficulty reaching the provider, please page the [facility] (Director on Call) for the Hospitalists  listed on amion for assistance.  06/05/2019, 4:03 AM

## 2019-06-05 NOTE — Progress Notes (Signed)
K of 2.6 down from 3.2 despite 2 runs IV K.  Wasn't able to tolerate PO potassium.  Ordering 6 more runs of IV K.

## 2019-06-05 NOTE — Progress Notes (Addendum)
I have seen and assessed patient and agree with Dr. Juleen China assessment and plan.  Patient is a 43 year old female history of hypertension, coronary artery disease status post PCI x2, vocal cord paralysis, morbid obesity, ongoing tobacco use, asthma with last exacerbation about 13 to 15 years ago per patient presenting to the ED with a nonproductive cough, increasing shortness of breath, diffuse wheezing, mild pleuritic discomfort with coughing only.  COVID-19 ordered which was initially negative.  Down nondiagnostic for PE due to contrast timing though no large/central PE seen.  No pneumonia or other lung findings.  Procalcitonin noted to be negative.  Patient placed on oral prednisone and albuterol treatments and being treated for an acute bronchitis/COPD exacerbation.  Patient assessed with significantly poor air movement, and acute respiratory distress with short choppy sentences.  Concerned that patient may tire out easily with impending respiratory failure may require intubation.  Will check an ABG.  DC prednisone and placed on IV Solu-Medrol 125 mg every 6 hours.  Repeat COVID-19 test and if negative will place on scheduled nebs.  Placed on Flonase, Claritin, PPI.  Continue scheduled MDIs pending repeat COVID-19 test and Dulera.  Due to respiratory status will consult with critical care medicine for further evaluation and management.  No charge.Marland Kitchen

## 2019-06-05 NOTE — ED Notes (Addendum)
Patient assisted to bedside commode. Patient experienced dyspnea on exertion.

## 2019-06-05 NOTE — ED Notes (Signed)
Changed linen because of dampness due to perspiration.

## 2019-06-05 NOTE — ED Notes (Signed)
Patient states that she is hungry and is requesting food. Admitting MD made aware.

## 2019-06-06 ENCOUNTER — Inpatient Hospital Stay (HOSPITAL_COMMUNITY): Payer: Medicaid - Out of State

## 2019-06-06 ENCOUNTER — Encounter (HOSPITAL_COMMUNITY): Payer: Self-pay | Admitting: Internal Medicine

## 2019-06-06 DIAGNOSIS — E876 Hypokalemia: Secondary | ICD-10-CM | POA: Diagnosis not present

## 2019-06-06 DIAGNOSIS — R0603 Acute respiratory distress: Secondary | ICD-10-CM | POA: Diagnosis not present

## 2019-06-06 DIAGNOSIS — Z720 Tobacco use: Secondary | ICD-10-CM

## 2019-06-06 DIAGNOSIS — J3800 Paralysis of vocal cords and larynx, unspecified: Secondary | ICD-10-CM | POA: Diagnosis not present

## 2019-06-06 DIAGNOSIS — J45901 Unspecified asthma with (acute) exacerbation: Secondary | ICD-10-CM

## 2019-06-06 DIAGNOSIS — I10 Essential (primary) hypertension: Secondary | ICD-10-CM | POA: Diagnosis not present

## 2019-06-06 DIAGNOSIS — I2510 Atherosclerotic heart disease of native coronary artery without angina pectoris: Secondary | ICD-10-CM | POA: Diagnosis present

## 2019-06-06 HISTORY — DX: Paralysis of vocal cords and larynx, unspecified: J38.00

## 2019-06-06 HISTORY — DX: Atherosclerotic heart disease of native coronary artery without angina pectoris: I25.10

## 2019-06-06 LAB — COMPREHENSIVE METABOLIC PANEL
ALT: 14 U/L (ref 0–44)
AST: 16 U/L (ref 15–41)
Albumin: 3.4 g/dL — ABNORMAL LOW (ref 3.5–5.0)
Alkaline Phosphatase: 95 U/L (ref 38–126)
Anion gap: 9 (ref 5–15)
BUN: 11 mg/dL (ref 6–20)
CO2: 24 mmol/L (ref 22–32)
Calcium: 9.4 mg/dL (ref 8.9–10.3)
Chloride: 106 mmol/L (ref 98–111)
Creatinine, Ser: 0.63 mg/dL (ref 0.44–1.00)
GFR calc Af Amer: >60 mL/min (ref >60)
GFR calc non Af Amer: >60 mL/min (ref >60)
Glucose, Bld: 182 mg/dL — ABNORMAL HIGH (ref 70–99)
Potassium: 3.6 mmol/L (ref 3.5–5.1)
Sodium: 139 mmol/L (ref 135–145)
Total Bilirubin: 0.3 mg/dL (ref 0.3–1.2)
Total Protein: 7.7 g/dL (ref 6.5–8.1)

## 2019-06-06 LAB — LIPID PANEL
Cholesterol: 128 mg/dL (ref 0–200)
HDL: 47 mg/dL (ref >40)
LDL Cholesterol: 69 mg/dL (ref 0–99)
Total CHOL/HDL Ratio: 2.7 RATIO
Triglycerides: 59 mg/dL (ref <150)
VLDL: 12 mg/dL (ref 0–40)

## 2019-06-06 LAB — CBC WITH DIFFERENTIAL/PLATELET
Abs Immature Granulocytes: 0.18 10*3/uL — ABNORMAL HIGH (ref 0.00–0.07)
Basophils Absolute: 0 10*3/uL (ref 0.0–0.1)
Basophils Relative: 0 %
Eosinophils Absolute: 0 10*3/uL (ref 0.0–0.5)
Eosinophils Relative: 0 %
HCT: 37.1 % (ref 36.0–46.0)
Hemoglobin: 12.1 g/dL (ref 12.0–15.0)
Immature Granulocytes: 1 %
Lymphocytes Relative: 7 %
Lymphs Abs: 1.3 10*3/uL (ref 0.7–4.0)
MCH: 29.9 pg (ref 26.0–34.0)
MCHC: 32.6 g/dL (ref 30.0–36.0)
MCV: 91.6 fL (ref 80.0–100.0)
Monocytes Absolute: 0.6 10*3/uL (ref 0.1–1.0)
Monocytes Relative: 4 %
Neutro Abs: 16.1 10*3/uL — ABNORMAL HIGH (ref 1.7–7.7)
Neutrophils Relative %: 88 %
Platelets: 395 10*3/uL (ref 150–400)
RBC: 4.05 MIL/uL (ref 3.87–5.11)
RDW: 13.7 % (ref 11.5–15.5)
WBC: 18.3 10*3/uL — ABNORMAL HIGH (ref 4.0–10.5)
nRBC: 0 % (ref 0.0–0.2)

## 2019-06-06 LAB — GLUCOSE, CAPILLARY
Glucose-Capillary: 134 mg/dL — ABNORMAL HIGH (ref 70–99)
Glucose-Capillary: 121 mg/dL — ABNORMAL HIGH (ref 70–99)
Glucose-Capillary: 158 mg/dL — ABNORMAL HIGH (ref 70–99)
Glucose-Capillary: 116 mg/dL — ABNORMAL HIGH (ref 70–99)

## 2019-06-06 LAB — BRAIN NATRIURETIC PEPTIDE: B Natriuretic Peptide: 88.6 pg/mL (ref 0.0–100.0)

## 2019-06-06 LAB — MAGNESIUM: Magnesium: 2.2 mg/dL (ref 1.7–2.4)

## 2019-06-06 LAB — MRSA PCR SCREENING: MRSA by PCR: NEGATIVE

## 2019-06-06 MED ORDER — NICOTINE 14 MG/24HR TD PT24
14.0000 mg | MEDICATED_PATCH | Freq: Every day | TRANSDERMAL | Status: DC
  Administered 2019-06-06 – 2019-06-08 (×3): 14 mg via TRANSDERMAL
  Filled 2019-06-06 (×3): qty 1

## 2019-06-06 MED ORDER — ALBUTEROL SULFATE (2.5 MG/3ML) 0.083% IN NEBU: 2.5000 mg | INHALATION_SOLUTION | Freq: Two times a day (BID) | RESPIRATORY_TRACT | Status: DC

## 2019-06-06 MED ORDER — AZITHROMYCIN 250 MG PO TABS
500.0000 mg | ORAL_TABLET | Freq: Every day | ORAL | Status: AC
  Administered 2019-06-06: 500 mg via ORAL
  Filled 2019-06-06: qty 2

## 2019-06-06 MED ORDER — FLUTICASONE FUROATE-VILANTEROL 200-25 MCG/INH IN AEPB
1.0000 | INHALATION_SPRAY | Freq: Every day | RESPIRATORY_TRACT | Status: DC
  Administered 2019-06-07: 1 via RESPIRATORY_TRACT
  Filled 2019-06-06: qty 28

## 2019-06-06 MED ORDER — PREDNISONE 20 MG PO TABS
40.0000 mg | ORAL_TABLET | Freq: Every day | ORAL | Status: DC
  Administered 2019-06-06 – 2019-06-08 (×3): 40 mg via ORAL
  Filled 2019-06-06 (×3): qty 2

## 2019-06-06 MED ORDER — CHLORHEXIDINE GLUCONATE CLOTH 2 % EX PADS
6.0000 | MEDICATED_PAD | Freq: Every day | CUTANEOUS | Status: DC
  Administered 2019-06-06 – 2019-06-07 (×2): 6 via TOPICAL

## 2019-06-06 MED ORDER — POTASSIUM CHLORIDE 10 MEQ/100ML IV SOLN
10.0000 meq | INTRAVENOUS | Status: AC
  Administered 2019-06-06 (×2): 10 meq via INTRAVENOUS
  Filled 2019-06-06 (×2): qty 100

## 2019-06-06 MED ORDER — AZITHROMYCIN 250 MG PO TABS
250.0000 mg | ORAL_TABLET | Freq: Every day | ORAL | Status: DC
  Administered 2019-06-07 – 2019-06-08 (×2): 250 mg via ORAL
  Filled 2019-06-06 (×2): qty 1

## 2019-06-06 NOTE — Consult Note (Signed)
   NAME:  Julia Ingram, MRN:  [id number], DOB:  [birth year], LOS: 1 ADMISSION DATE:  06/04/2019, CONSULTATION DATE:  06/05/19 REFERRING MD:  Grandville Silos, CHIEF COMPLAINT:  SOB   Brief History   43 year old woman with hx of HTN, CAD s/p PCI, morbid obesity, OSA p/w URI symptoms that progressed to bronchospasm.  History of present illness   43 year old woman with hx of HTN, CAD s/p PCI, morbid obesity, OSA p/w URI symptoms (L sinus pain sore throat) that progressed to cough, dyspnea and bronchospasm.  Not really improving in ER so PCCM consulted. Patient denies sick contacts.  COVID neg.   Never intubated for breathing.  Is still smoking unfortunately.  Past Medical History  CAD HTN Obesity Metabolic syndrome OSA  Significant Hospital Events   06/05/19 admitted  Consults:  NA  Procedures:  NA  Significant Diagnostic Tests:  CTA: atelectasis  Micro Data:  COVID Neg  Antimicrobials:  Ceftriaxone 10/29>>  Interim history/subjective:  Upset regarding cardiac diet. Nebs making her jittery. Still occasional wheezing. Rhinovirus on resp PCR.  Objective   Blood pressure (!) 144/96, pulse 78, temperature 98.1 F (36.7 C), temperature source Oral, resp. rate (!) 29, height 5\' 10"  (1.778 m), weight 134.7 kg, SpO2 95 %.        Intake/Output Summary (Last 24 hours) at 06/06/2019 0924 Last data filed at 06/06/2019 0700 Gross per 24 hour  Intake 971.85 ml  Output 1050 ml  Net -78.15 ml   Filed Weights   06/05/19 0048 06/06/19 0100  Weight: 132.9 kg 134.7 kg    Examination: GEN: Obese woman in NAD HEENT: cobblestoning of posterior nasal mucosa, malampatti 3 CV: RRR, ext warm PULM: Wheezing over larynx and throughout lungs GI: Soft, +BS EXT: No edema NEURO: moves all 4 ext to command PSYCH: AOx3, good insight SKIN: No rashes   Resolved Hospital Problem list   NA  Assessment & Plan:  # Acute respiratory distress- from rhinovirus induced croup and bronchospasm on top  of underlying vocal cord paralysis, asthma.  Follows now with [facility] in [place]. # Vocal cord paralysis- s/p multiple surgeries with ENT until she lost insurance last year.  Lots of glottic dysfunction with this. # Smoking- does not help things # Metabolic syndrome (OSA, HTN, fatty liver, CAD)   - Switch inhalers to breo, incruse, PRN albuterol nebs - Switch steroids to PO 40mg  x 5 days then stop - Singulair, claritin, PPI, ARB  - DC ceftriaxone, give Z pack - Needs to f/u with pulm and ENT back at [facility] - Fine to liberalize diet if it will keep her here one more day - Should be okay for DC tomorrow - Will sign off, call if questions concerns  Erskine Emery MD

## 2019-06-06 NOTE — Progress Notes (Signed)
PT demonstrated hands on understanding of Flutter device- strong cough at this time.

## 2019-06-06 NOTE — Progress Notes (Signed)
PROGRESS NOTE    Julia Ingram  ZOX:[id number] DOB: 08-Nov-1975 DOA: 06/04/2019 PCP: Patient, No Pcp Per    Brief Narrative:  HPI per Dr. Lesia HausenGardner Julia Zenovia JordanMister is a 43 y.o. female with medical history significant of HTN, CAD s/p PCI X2, morbid obesity.  Patient presents to the ED with c/o SOB.  Had preceding nasal congestion and postnasal drip, sinus pressure.  Saw doctor a few days ago for this, prescribed amoxicillin.  Cough onset yesterday, cough is non-productive.  Increasing SOB last night.  This has progressively worsened.  Mild pleuritic discomfort with coughing only.  No CP.  No known sick contacts, syncope, leg swelling, loss of smell or taste.  Has history of "asthma" in the past but this hasnt bothered her for over 15 years she estimates.   ED Course: Tm 100.4, HR 100-110s after albuterol.  Satting 96% on 2L.  D.Dimer 2.59, WBC 13.6k  CTA: 1) non-diagnostic for PE due to contrast timing, though no large / central PE seen 2) No PNA or other lung findings  Procalcitonin negative.  Given albuterol treatments, steroids.   Assessment & Plan:   Principal Problem:   Acute respiratory distress Active Problems:   Acute asthma exacerbation   Essential hypertension   Morbid (severe) obesity due to excess calories (HCC)   Hypokalemia   Acute respiratory failure (HCC)   Vocal cord paralysis   Tobacco abuse   CAD (coronary artery disease)  1 acute respiratory distress/rhinovirus Secondary to rhinovirus induced croup and bronchospasms in the setting of underlying vocal cord paralysis and asthma. Patient on admission was in acute respiratory distress with poor air movement, diffuse wheezing, speaking in choppy sentences and use of accessory muscles of respiration.  SARS Covid 2 -x2.  Respiratory viral panel positive for rhinovirus.  Repeat chest x-ray done this morning without any acute infiltrate.  CT angiogram chest with limited evaluation however no  definite CT evidence of large central pulmonary artery embolus.  Bilateral lower lobe subsegmental and linear atelectasis.  Patient improving clinically however not at baseline.  Continue IV Solu-Medrol 60 mg IV every 8 hours, Pulmicort, Claritin, Flonase, IV antibiotics, PPI.  Patient being transitioned from IV Rocephin to oral azithromycin per PCCM.  Placed on chest PT.  Patient seen in consultation by PCCM and appreciate their input and recommendations.  2.  Vocal cord paralysis Status post multiple surgeries by ENT until patient noted to have lost her insurance.  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  Will need outpatient follow-up.  Patient [place] with [facility] in [place].  3.  Tobacco abuse Tobacco cessation.  Placed on a nicotine patch.  4.  Hypertension Patient started on Norvasc 10 mg daily yesterday.  Patient's ACE inhibitor changed to ARB secondary to problem #1 which we will continue.  Continue Toprol-XL.  Hydralazine as needed.  5.  Hypokalemia Likely secondary to nebulizers.  Patient denies any diarrhea or emesis.  Potassium at 3.6 today.  KCl 10 mEq IV every 1 hour x2 rounds.  Magnesium at 2.2.  Follow.  6.  Morbid obesity  7.  Coronary artery disease status post PCI Currently stable.  Continue Crestor, Toprol-XL.  ACE inhibitor changed to ARB secondary to problem #1.  Patient started on Norvasc for better blood pressure control.   DVT prophylaxis: Lovenox Code Status: Full Family Communication: Updated patient.  No family at bedside. Disposition Plan: Remain in stepdown unit.   Consultants:   PCCM: Dr. Katrinka BlazingSmith 06/05/2019  Procedures:   CT angiogram  06/05/2019  Chest x-ray 06/04/2019, 06/06/2019  Antimicrobials:   Azithromycin 06/06/2019   IV Rocephin 06/05/2019>>>> 06/06/2019   Subjective: Patient lying in bed on the telephone.  States she is starving.  Some improvement with shortness of breath since admission however not at baseline.  Denies any  chest pain.  No abdominal pain.  Asking when she will be able to go home.  Objective: Vitals:   06/06/19 0500 06/06/19 0600 06/06/19 0700 06/06/19 0800  BP: 130/71 (!) 138/91 (!) 144/96   Pulse: 78 80 78   Resp: (!) 22 14 (!) 29   Temp:    98.1 F (36.7 C)  TempSrc:    Oral  SpO2: 94% 95% 95%   Weight:      Height:        Intake/Output Summary (Last 24 hours) at 06/06/2019 1018 Last data filed at 06/06/2019 0700 Gross per 24 hour  Intake 971.85 ml  Output 1050 ml  Net -78.15 ml   Filed Weights   06/05/19 0048 06/06/19 0100  Weight: 132.9 kg 134.7 kg    Examination:  General exam: Appears calm and comfortable. Respiratory system: Diffuse expiratory wheezing.  Fair air movement.  Speaking in less choppy sentences.   Cardiovascular system: S1 & S2 heard, RRR. No JVD, murmurs, rubs, gallops or clicks. No pedal edema. Gastrointestinal system: Abdomen is obese, nondistended, soft and nontender. No organomegaly or masses felt. Normal bowel sounds heard. Central nervous system: Alert and oriented. No focal neurological deficits. Extremities: Symmetric 5 x 5 power. Skin: No rashes, lesions or ulcers Psychiatry: Judgement and insight appear normal. Mood & affect appropriate.     Data Reviewed: I have personally reviewed following labs and imaging studies  CBC: Recent Labs  Lab 06/04/19 2300 06/05/19 0432 06/06/19 0231  WBC 13.6* 12.7* 18.3*  NEUTROABS 9.6*  --  16.1*  HGB 12.1 12.4 12.1  HCT 37.7 38.3 37.1  MCV 91.3 92.5 91.6  PLT 376 372 395   Basic Metabolic Panel: Recent Labs  Lab 06/04/19 2300 06/05/19 0422 06/05/19 0630 06/05/19 1511 06/06/19 0231  NA 141 139  --  142 139  K 3.1* 2.6*  --  3.8 3.6  CL 105 103  --  113* 106  CO2 26 24  --  22 24  GLUCOSE 114* 272*  --  138* 182*  BUN 10 11  --  9 11  CREATININE 0.70 0.83  --  0.53 0.63  CALCIUM 8.7* 8.6*  --  8.3* 9.4  MG  --   --  1.9  --  2.2   GFR: Estimated Creatinine Clearance: 133.2 mL/min  (by C-G formula based on SCr of 0.63 mg/dL). Liver Function Tests: Recent Labs  Lab 06/04/19 2300 06/06/19 0231  AST 15 16  ALT 13 14  ALKPHOS 100 95  BILITOT 0.4 0.3  PROT 7.4 7.7  ALBUMIN 3.5 3.4*   No results for input(s): LIPASE, AMYLASE in the last 168 hours. No results for input(s): AMMONIA in the last 168 hours. Coagulation Profile: No results for input(s): INR, PROTIME in the last 168 hours. Cardiac Enzymes: No results for input(s): CKTOTAL, CKMB, CKMBINDEX, TROPONINI in the last 168 hours. BNP (last 3 results) No results for input(s): PROBNP in the last 8760 hours. HbA1C: No results for input(s): HGBA1C in the last 72 hours. CBG: Recent Labs  Lab 06/05/19 0930 06/05/19 1950  GLUCAP 160* 164*   Lipid Profile: Recent Labs    06/04/19 2300 06/06/19 0231  CHOL  --  128  HDL  --  47  LDLCALC  --  69  TRIG 67 59  CHOLHDL  --  2.7   Thyroid Function Tests: No results for input(s): TSH, T4TOTAL, FREET4, T3FREE, THYROIDAB in the last 72 hours. Anemia Panel: Recent Labs    06/04/19 2300  FERRITIN 46   Sepsis Labs: Recent Labs  Lab 06/04/19 2300  PROCALCITON <0.10  LATICACIDVEN 1.5    Recent Results (from the past 240 hour(s))  SARS Coronavirus 2 by RT PCR (hospital order, performed in [facility] hospital lab) Nasopharyngeal Nasopharyngeal Swab     Status: None   Collection Time: 06/04/19 11:00 PM   Specimen: Nasopharyngeal Swab  Result Value Ref Range Status   SARS Coronavirus 2 NEGATIVE NEGATIVE Final    Comment: (NOTE) If result is NEGATIVE SARS-CoV-2 target nucleic acids are NOT DETECTED. The SARS-CoV-2 RNA is generally detectable in upper and lower  respiratory specimens during the acute phase of infection. The lowest  concentration of SARS-CoV-2 viral copies this assay can detect is 250  copies / mL. A negative result does not preclude SARS-CoV-2 infection  and should not be used as the sole basis for treatment or other  patient management  decisions.  A negative result may occur with  improper specimen collection / handling, submission of specimen other  than nasopharyngeal swab, presence of viral mutation(s) within the  areas targeted by this assay, and inadequate number of viral copies  (<250 copies / mL). A negative result must be combined with clinical  observations, patient history, and epidemiological information. If result is POSITIVE SARS-CoV-2 target nucleic acids are DETECTED. The SARS-CoV-2 RNA is generally detectable in upper and lower  respiratory specimens dur ing the acute phase of infection.  Positive  results are indicative of active infection with SARS-CoV-2.  Clinical  correlation with patient history and other diagnostic information is  necessary to determine patient infection status.  Positive results do  not rule out bacterial infection or co-infection with other viruses. If result is PRESUMPTIVE POSTIVE SARS-CoV-2 nucleic acids MAY BE PRESENT.   A presumptive positive result was obtained on the submitted specimen  and confirmed on repeat testing.  While 2019 novel coronavirus  (SARS-CoV-2) nucleic acids may be present in the submitted sample  additional confirmatory testing may be necessary for epidemiological  and / or clinical management purposes  to differentiate between  SARS-CoV-2 and other Sarbecovirus currently known to infect humans.  If clinically indicated additional testing with an alternate test  methodology [phone]) is advised. The SARS-CoV-2 RNA is generally  detectable in upper and lower respiratory sp ecimens during the acute  phase of infection. The expected result is Negative. Fact Sheet for Patients:  BoilerBrush.com.cy Fact Sheet for Healthcare Providers: https://pope.com/ This test is not yet approved or cleared by the [place] FDA and has been authorized for detection and/or diagnosis of SARS-CoV-2 by FDA under an  Emergency Use Authorization (EUA).  This EUA will remain in effect (meaning this test can be used) for the duration of the COVID-19 declaration under Section 564(b)(1) of the Act, 21 U.S.C. section 360bbb-3(b)(1), unless the authorization is terminated or revoked sooner. Performed at [facility], [address] [place], [place] [address]   Blood Culture (routine x 2)     Status: None (Preliminary result)   Collection Time: 06/04/19 11:01 PM   Specimen: BLOOD RIGHT WRIST  Result Value Ref Range Status   Specimen Description   Final    BLOOD  RIGHT WRIST Performed at [facility], [place] [address] [place], [place] [address]    Special Requests   Final    BOTTLES DRAWN AEROBIC AND ANAEROBIC Blood Culture adequate volume Performed at [place] [address] [place], [place] [address]    Culture   Final    NO GROWTH 1 DAY Performed at [facility], [place] [address] [place], [place] [address]    Report Status PENDING  Incomplete  Blood Culture (routine x 2)     Status: None (Preliminary result)   Collection Time: 06/04/19 11:01 PM   Specimen: BLOOD LEFT ARM  Result Value Ref Range Status   Specimen Description   Final    BLOOD LEFT ARM Performed at [facility], [place] [address] [place], [place] [address]    Special Requests   Final    BOTTLES DRAWN AEROBIC AND ANAEROBIC Blood Culture adequate volume Performed at [place] [address], [place], [place] [address]    Culture   Final    NO GROWTH 1 DAY Performed at [facility], [place] [address] [place], [place] [address]    Report Status PENDING  Incomplete  Respiratory Panel by PCR     Status: Abnormal   Collection Time: 06/05/19  3:38 AM   Specimen: Nasopharyngeal Swab; Respiratory  Result Value Ref Range Status   Adenovirus NOT DETECTED NOT DETECTED Final   Coronavirus 229E NOT DETECTED NOT DETECTED Final    Comment: (NOTE) The  Coronavirus on the Respiratory Panel, DOES NOT test for the novel  Coronavirus (2019 nCoV)    Coronavirus HKU1 NOT DETECTED NOT DETECTED Final   Coronavirus NL63 NOT DETECTED NOT DETECTED Final   Coronavirus OC43 NOT DETECTED NOT DETECTED Final   Metapneumovirus NOT DETECTED NOT DETECTED Final   Rhinovirus / Enterovirus DETECTED (A) NOT DETECTED Final   Influenza A NOT DETECTED NOT DETECTED Final   Influenza B NOT DETECTED NOT DETECTED Final   Parainfluenza Virus 1 NOT DETECTED NOT DETECTED Final   Parainfluenza Virus 2 NOT DETECTED NOT DETECTED Final   Parainfluenza Virus 3 NOT DETECTED NOT DETECTED Final   Parainfluenza Virus 4 NOT DETECTED NOT DETECTED Final   Respiratory Syncytial Virus NOT DETECTED NOT DETECTED Final   Bordetella pertussis NOT DETECTED NOT DETECTED Final   Chlamydophila pneumoniae NOT DETECTED NOT DETECTED Final   Mycoplasma pneumoniae NOT DETECTED NOT DETECTED Final    Comment: Performed at [facility], [place], [place], [place] [address]  SARS Coronavirus 2 by RT PCR (hospital order, performed in [facility] hospital lab) Nasopharyngeal Nasopharyngeal Swab     Status: None   Collection Time: 06/05/19 12:07 PM   Specimen: Nasopharyngeal Swab  Result Value Ref Range Status   SARS Coronavirus 2 NEGATIVE NEGATIVE Final    Comment: (NOTE) If result is NEGATIVE SARS-CoV-2 target nucleic acids are NOT DETECTED. The SARS-CoV-2 RNA is generally detectable in upper and lower  respiratory specimens during the acute phase of infection. The lowest  concentration of SARS-CoV-2 viral copies this assay can detect is 250  copies / mL. A negative result does not preclude SARS-CoV-2 infection  and should not be used as the sole basis for treatment or other  patient management decisions.  A negative result may occur with  improper specimen collection / handling, submission of specimen other  than nasopharyngeal swab, presence of viral mutation(s) within the  areas  targeted by this  assay, and inadequate number of viral copies  (<250 copies / mL). A negative result must be combined with clinical  observations, patient history, and epidemiological information. If result is POSITIVE SARS-CoV-2 target nucleic acids are DETECTED. The SARS-CoV-2 RNA is generally detectable in upper and lower  respiratory specimens dur ing the acute phase of infection.  Positive  results are indicative of active infection with SARS-CoV-2.  Clinical  correlation with patient history and other diagnostic information is  necessary to determine patient infection status.  Positive results do  not rule out bacterial infection or co-infection with other viruses. If result is PRESUMPTIVE POSTIVE SARS-CoV-2 nucleic acids MAY BE PRESENT.   A presumptive positive result was obtained on the submitted specimen  and confirmed on repeat testing.  While 2019 novel coronavirus  (SARS-CoV-2) nucleic acids may be present in the submitted sample  additional confirmatory testing may be necessary for epidemiological  and / or clinical management purposes  to differentiate between  SARS-CoV-2 and other Sarbecovirus currently known to infect humans.  If clinically indicated additional testing with an alternate test  methodology [phone]) is advised. The SARS-CoV-2 RNA is generally  detectable in upper and lower respiratory sp ecimens during the acute  phase of infection. The expected result is Negative. Fact Sheet for Patients:  BoilerBrush.com.cy Fact Sheet for Healthcare Providers: https://pope.com/ This test is not yet approved or cleared by the [place] FDA and has been authorized for detection and/or diagnosis of SARS-CoV-2 by FDA under an Emergency Use Authorization (EUA).  This EUA will remain in effect (meaning this test can be used) for the duration of the COVID-19 declaration under Section 564(b)(1) of the Act, 21 U.S.C. section  360bbb-3(b)(1), unless the authorization is terminated or revoked sooner. Performed at [facility], [address] [place], [place] [address]   MRSA PCR Screening     Status: None   Collection Time: 06/06/19  1:00 AM   Specimen: Nasopharyngeal  Result Value Ref Range Status   MRSA by PCR NEGATIVE NEGATIVE Final    Comment:        The GeneXpert MRSA Assay (FDA approved for NASAL specimens only), is one component of a comprehensive MRSA colonization surveillance program. It is not intended to diagnose MRSA infection nor to guide or monitor treatment for MRSA infections. Performed at [facility], [address] [place], [place] [address]          Radiology Studies: Dg Chest 2 View  Result Date: 06/04/2019 CLINICAL DATA:  Shortness of breath EXAM: CHEST - 2 VIEW COMPARISON:  10/15/2018 FINDINGS: The heart size and mediastinal contours are within normal limits. Both lungs are clear. The visualized skeletal structures are unremarkable. IMPRESSION: No active cardiopulmonary disease. Electronically Signed   By: Deatra Robinson M.D.   On: 06/04/2019 21:06   Ct Angio Chest Pe W And/or Wo Contrast  Result Date: 06/05/2019 CLINICAL DATA:  43 year old female with shortness of breath. Concern for pulmonary embolism. EXAM: CT ANGIOGRAPHY CHEST WITH CONTRAST TECHNIQUE: Multidetector CT imaging of the chest was performed using the standard protocol during bolus administration of intravenous contrast. Multiplanar CT image reconstructions and MIPs were obtained to evaluate the vascular anatomy. CONTRAST:  OMNIPAQUE IOHEXOL 350 MG/ML SOLN COMPARISON:  Chest radiograph dated 06/04/2019 and CT dated 06/01/2018. FINDINGS: Evaluation is limited due to patient's body habitus and respiratory motion artifact. Evaluation is also limited due to suboptimal opacification of the pulmonary arteries and timing of the contrast. Cardiovascular: Borderline cardiomegaly.  No pericardial effusion. The thoracic aorta is unremarkable. The visualized origins of the great vessels of the aortic arch appear patent. Evaluation of the pulmonary arteries is very limited, almost nondiagnostic due to the factors above. No large or saddle embolus identified in the pulmonary trunk or main pulmonary arteries. If there is high clinical concern for acute PE, V/Q scan may provide better evaluation. Mediastinum/Nodes: There is no hilar or mediastinal adenopathy. The esophagus is grossly unremarkable. No mediastinal fluid collection. Lungs/Pleura: Bilateral lower lobe subsegmental and linear atelectasis noted. No lobar consolidation, pleural effusion, or pneumothorax. The central airways are patent. Upper Abdomen: No acute abnormality. Musculoskeletal: No chest wall abnormality. No acute or significant osseous findings. Review of the MIP images confirms the above findings. IMPRESSION: 1. No definite CT evidence of large central pulmonary artery embolus. Evaluation is very limited, almost nondiagnostic, due to patient's body habitus and respiratory motion artifact. If there is high clinical concern for acute PE, V/Q scan may provide better evaluation. 2. Bilateral lower lobe subsegmental and linear atelectasis. Electronically Signed   By: Elgie Collard M.D.   On: 06/05/2019 03:12   Dg Chest Port 1 View  Result Date: 06/06/2019 CLINICAL DATA:  Respiratory distress EXAM: PORTABLE CHEST 1 VIEW COMPARISON:  None. FINDINGS: The heart size and mediastinal contours are within normal limits. Both lungs are clear. The visualized skeletal structures are unremarkable. IMPRESSION: No active disease. Electronically Signed   By: Deatra Robinson M.D.   On: 06/06/2019 04:48        Scheduled Meds: . amLODipine  10 mg Oral Daily  . aspirin EC  81 mg Oral Daily  . [START ON 06/07/2019] azithromycin  250 mg Oral Daily  . Chlorhexidine Gluconate Cloth  6 each Topical Daily  . enoxaparin (LOVENOX) injection   60 mg Subcutaneous Daily  . fluticasone  2 spray Each Nare Daily  . fluticasone furoate-vilanterol  1 puff Inhalation Daily  . loratadine  10 mg Oral QHS  . losartan  100 mg Oral Daily  . metoprolol succinate  100 mg Oral Daily  . montelukast  10 mg Oral QHS  . nicotine  14 mg Transdermal Daily  . pantoprazole  40 mg Oral Q0600  . [START ON 06/07/2019] predniSONE  40 mg Oral Q breakfast  . rosuvastatin  40 mg Oral q1800  . umeclidinium bromide  1 puff Inhalation Daily   Continuous Infusions: . potassium chloride 10 mEq (06/06/19 1010)     LOS: 1 day    Time spent: 40 minutes    Ramiro Harvest, MD [facility]  If 7PM-7AM, please contact night-coverage [email or web address] 06/06/2019, 10:18 AM

## 2019-06-06 NOTE — Progress Notes (Addendum)
ADM: ACUTE RESPIRATORY DISTRESS HOME REGIMEN: NONE  X-RAY: 06/05/2019 BOTH LUNGS ARE CLEAR  ASSESSMENT: NO WOB, NO SOB, PT ON RA SATS 94%, BS-DIM CURRENT THERAPY: ALBUTEROL TID, PULMICORT BID, CPT Q4, BREO QD, INCRUSE QD PLAN: BREO QD, INCRUSE QD PER DR.SMITH, FLUTTER VALVE QSWA RT CHANGED THERAPY DUE TO X-RAY AND BS.

## 2019-06-06 NOTE — ED Notes (Signed)
Call to [facility] who referred me to [facility] who after discussing pt current condition ok'd the d/c of [facility] and ok pt to remain at [facility] stepdown.

## 2019-06-07 LAB — GLUCOSE, CAPILLARY
Glucose-Capillary: 108 mg/dL — ABNORMAL HIGH (ref 70–99)
Glucose-Capillary: 171 mg/dL — ABNORMAL HIGH (ref 70–99)
Glucose-Capillary: 114 mg/dL — ABNORMAL HIGH (ref 70–99)

## 2019-06-07 LAB — BASIC METABOLIC PANEL
Anion gap: 10 (ref 5–15)
BUN: 18 mg/dL (ref 6–20)
CO2: 24 mmol/L (ref 22–32)
Calcium: 8.6 mg/dL — ABNORMAL LOW (ref 8.9–10.3)
Chloride: 108 mmol/L (ref 98–111)
Creatinine, Ser: 0.56 mg/dL (ref 0.44–1.00)
GFR calc Af Amer: >60 mL/min (ref >60)
GFR calc non Af Amer: >60 mL/min (ref >60)
Glucose, Bld: 101 mg/dL — ABNORMAL HIGH (ref 70–99)
Potassium: 3.5 mmol/L (ref 3.5–5.1)
Sodium: 142 mmol/L (ref 135–145)

## 2019-06-07 LAB — CBC WITH DIFFERENTIAL/PLATELET
Abs Immature Granulocytes: 0.09 10*3/uL — ABNORMAL HIGH (ref 0.00–0.07)
Basophils Absolute: 0 10*3/uL (ref 0.0–0.1)
Basophils Relative: 0 %
Eosinophils Absolute: 0 10*3/uL (ref 0.0–0.5)
Eosinophils Relative: 0 %
HCT: 39.3 % (ref 36.0–46.0)
Hemoglobin: 12.6 g/dL (ref 12.0–15.0)
Immature Granulocytes: 1 %
Lymphocytes Relative: 25 %
Lymphs Abs: 4.2 10*3/uL — ABNORMAL HIGH (ref 0.7–4.0)
MCH: 29.6 pg (ref 26.0–34.0)
MCHC: 32.1 g/dL (ref 30.0–36.0)
MCV: 92.5 fL (ref 80.0–100.0)
Monocytes Absolute: 1.6 10*3/uL — ABNORMAL HIGH (ref 0.1–1.0)
Monocytes Relative: 9 %
Neutro Abs: 11.1 10*3/uL — ABNORMAL HIGH (ref 1.7–7.7)
Neutrophils Relative %: 65 %
Platelets: 440 10*3/uL — ABNORMAL HIGH (ref 150–400)
RBC: 4.25 MIL/uL (ref 3.87–5.11)
RDW: 13.9 % (ref 11.5–15.5)
WBC: 17 10*3/uL — ABNORMAL HIGH (ref 4.0–10.5)
nRBC: 0 % (ref 0.0–0.2)

## 2019-06-07 LAB — MAGNESIUM: Magnesium: 2.2 mg/dL (ref 1.7–2.4)

## 2019-06-07 MED ORDER — POTASSIUM CHLORIDE CRYS ER 10 MEQ PO TBCR
40.0000 meq | EXTENDED_RELEASE_TABLET | Freq: Once | ORAL | Status: AC
  Administered 2019-06-07: 10:00:00 40 meq via ORAL
  Filled 2019-06-07: qty 4

## 2019-06-07 MED ORDER — IBUPROFEN 200 MG PO TABS
200.0000 mg | ORAL_TABLET | Freq: Once | ORAL | Status: AC
  Administered 2019-06-07: 200 mg via ORAL
  Filled 2019-06-07: qty 1

## 2019-06-07 MED ORDER — POTASSIUM CHLORIDE 10 MEQ/100ML IV SOLN: 10.0000 meq | INTRAVENOUS | Status: DC

## 2019-06-07 NOTE — Progress Notes (Signed)
PROGRESS NOTE    Julia Ingram  ZOX:[id number] DOB: 09-29-1975 DOA: 06/04/2019 PCP: Patient, No Pcp Per    Brief Narrative:  HPI per Dr. Lesia Hausen Enslin is a 43 y.o. female with medical history significant of HTN, CAD s/p PCI X2, morbid obesity.  Patient presents to the ED with c/o SOB.  Had preceding nasal congestion and postnasal drip, sinus pressure.  Saw doctor a few days ago for this, prescribed amoxicillin.  Cough onset yesterday, cough is non-productive.  Increasing SOB last night.  This has progressively worsened.  Mild pleuritic discomfort with coughing only.  No CP.  No known sick contacts, syncope, leg swelling, loss of smell or taste.  Has history of "asthma" in the past but this hasnt bothered her for over 15 years she estimates.   ED Course: Tm 100.4, HR 100-110s after albuterol.  Satting 96% on 2L.  D.Dimer 2.59, WBC 13.6k  CTA: 1) non-diagnostic for PE due to contrast timing, though no large / central PE seen 2) No PNA or other lung findings  Procalcitonin negative.  Given albuterol treatments, steroids.   Assessment & Plan:   Principal Problem:   Acute respiratory distress Active Problems:   Acute asthma exacerbation   Essential hypertension   Morbid (severe) obesity due to excess calories (HCC)   Hypokalemia   Acute respiratory failure (HCC)   Vocal cord paralysis   Tobacco abuse   CAD (coronary artery disease)  1 acute respiratory distress/rhinovirus Secondary to rhinovirus induced croup and bronchospasms in the setting of underlying vocal cord paralysis and asthma. Patient on admission was in acute respiratory distress with poor air movement, diffuse wheezing, speaking in choppy sentences and use of accessory muscles of respiration.  SARS Covid 2  Negative x2.  Respiratory viral panel positive for rhinovirus/enterovirus.  Repeat chest x-ray done without any acute infiltrate.  CT angiogram chest with limited evaluation however  no definite CT evidence of large central pulmonary artery embolus.  Bilateral lower lobe subsegmental and linear atelectasis.  Patient improving clinically however not at baseline.  Patient has been transitioned from IV Solu-Medrol to oral prednisone per PCCM.  IV Rocephin has been changed to oral azithromycin.  Continue Brio Ellipta, Singulair, Protonix, Incruse.  Continue chest PT.  Patient was seen by PCCM and appreciate their input and recommendations.   2.  Vocal cord paralysis Status post multiple surgeries by ENT until patient noted to have lost her insurance.  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  Will need outpatient follow-up.  Patient follows with [facility] in [place].  3.  Tobacco abuse Tobacco cessation.  Continue nicotine patch.    4.  Hypertension Blood pressure improved.  Continue Norvasc 10 mg daily, Cozaar 100 mg daily, Toprol-XL 100 mg daily. Patient's ACE inhibitor changed to ARB secondary to problem #1 which we will continue. Hydralazine as needed.  Outpatient follow-up with PCP.  5.  Hypokalemia Likely secondary to nebulizers.  Patient denies any diarrhea or emesis.  Potassium at 3.5 today.  K. Dur 40 mEq p.o. x1.  Magnesium at 2.2.  Follow.  6.  Morbid obesity  7.  Coronary artery disease status post PCI Currently stable.  Continue Crestor, Toprol-XL.  ACE inhibitor changed to ARB secondary to problem #1.  Patient started on Norvasc for better blood pressure control.   DVT prophylaxis: Lovenox Code Status: Full Family Communication: Updated patient.  No family at bedside. Disposition Plan: Transfer to MedSurg.    Consultants:   PCCM: Dr. Katrinka Blazing 06/05/2019  Procedures:   CT angiogram 06/05/2019  Chest x-ray 06/04/2019, 06/06/2019  Antimicrobials:   Azithromycin 06/06/2019   IV Rocephin 06/05/2019>>>> 06/06/2019   Subjective: Patient sitting up in bed.  States shortness of breath is improved since admission.  Denies any chest pain.  States has  some intermittent wheezing.   Objective: Vitals:   06/07/19 0440 06/07/19 0600 06/07/19 0608 06/07/19 0800  BP:   (!) 164/98 (!) 158/88  Pulse:   69 66  Resp:   19 18  Temp: 97.7 F (36.5 C)     TempSrc: Oral     SpO2: 93%  90% 95%  Weight:  131.8 kg    Height:        Intake/Output Summary (Last 24 hours) at 06/07/2019 0910 Last data filed at 06/07/2019 0537 Gross per 24 hour  Intake 480 ml  Output 2000 ml  Net -1520 ml   Filed Weights   06/05/19 0048 06/06/19 0100 06/07/19 0600  Weight: 132.9 kg 134.7 kg 131.8 kg    Examination:  General exam: NAD Respiratory system: Fair air movement.  Minimal expiratory wheezing.  Some scattered coarse breath sounds.  Speaking in full sentences.  Normal respiratory effort.  Cardiovascular system: Regular rate and rhythm no murmurs rubs or gallops.  No JVD.  No lower extremity edema. Gastrointestinal system: Abdomen is obese, soft, nontender, nondistended, positive bowel sounds.  No rebound.  No guarding.  Central nervous system: Alert and oriented. No focal neurological deficits. Extremities: Symmetric 5 x 5 power. Skin: No rashes, lesions or ulcers Psychiatry: Judgement and insight appear normal. Mood & affect appropriate.     Data Reviewed: I have personally reviewed following labs and imaging studies  CBC: Recent Labs  Lab 06/04/19 2300 06/05/19 0432 06/06/19 0231 06/07/19 0623  WBC 13.6* 12.7* 18.3* 17.0*  NEUTROABS 9.6*  --  16.1* 11.1*  HGB 12.1 12.4 12.1 12.6  HCT 37.7 38.3 37.1 39.3  MCV 91.3 92.5 91.6 92.5  PLT 376 372 395 440*   Basic Metabolic Panel: Recent Labs  Lab 06/04/19 2300 06/05/19 0422 06/05/19 0630 06/05/19 1511 06/06/19 0231 06/07/19 0623  NA 141 139  --  142 139 142  K 3.1* 2.6*  --  3.8 3.6 3.5  CL 105 103  --  113* 106 108  CO2 26 24  --  22 24 24   GLUCOSE 114* 272*  --  138* 182* 101*  BUN 10 11  --  9 11 18   CREATININE 0.70 0.83  --  0.53 0.63 0.56  CALCIUM 8.7* 8.6*  --  8.3* 9.4  8.6*  MG  --   --  1.9  --  2.2 2.2   GFR: Estimated Creatinine Clearance: 131.5 mL/min (by C-G formula based on SCr of 0.56 mg/dL). Liver Function Tests: Recent Labs  Lab 06/04/19 2300 06/06/19 0231  AST 15 16  ALT 13 14  ALKPHOS 100 95  BILITOT 0.4 0.3  PROT 7.4 7.7  ALBUMIN 3.5 3.4*   No results for input(s): LIPASE, AMYLASE in the last 168 hours. No results for input(s): AMMONIA in the last 168 hours. Coagulation Profile: No results for input(s): INR, PROTIME in the last 168 hours. Cardiac Enzymes: No results for input(s): CKTOTAL, CKMB, CKMBINDEX, TROPONINI in the last 168 hours. BNP (last 3 results) No results for input(s): PROBNP in the last 8760 hours. HbA1C: No results for input(s): HGBA1C in the last 72 hours. CBG: Recent Labs  Lab 06/06/19 0804 06/06/19 1131 06/06/19 1632 06/06/19 2155 06/07/19  0821  GLUCAP 134* 121* 158* 116* 108*   Lipid Profile: Recent Labs    06/04/19 2300 06/06/19 0231  CHOL  --  128  HDL  --  47  LDLCALC  --  69  TRIG 67 59  CHOLHDL  --  2.7   Thyroid Function Tests: No results for input(s): TSH, T4TOTAL, FREET4, T3FREE, THYROIDAB in the last 72 hours. Anemia Panel: Recent Labs    06/04/19 2300  FERRITIN 46   Sepsis Labs: Recent Labs  Lab 06/04/19 2300  PROCALCITON <0.10  LATICACIDVEN 1.5    Recent Results (from the past 240 hour(s))  SARS Coronavirus 2 by RT PCR (hospital order, performed in [facility] hospital lab) Nasopharyngeal Nasopharyngeal Swab     Status: None   Collection Time: 06/04/19 11:00 PM   Specimen: Nasopharyngeal Swab  Result Value Ref Range Status   SARS Coronavirus 2 NEGATIVE NEGATIVE Final    Comment: (NOTE) If result is NEGATIVE SARS-CoV-2 target nucleic acids are NOT DETECTED. The SARS-CoV-2 RNA is generally detectable in upper and lower  respiratory specimens during the acute phase of infection. The lowest  concentration of SARS-CoV-2 viral copies this assay can detect is 250  copies  / mL. A negative result does not preclude SARS-CoV-2 infection  and should not be used as the sole basis for treatment or other  patient management decisions.  A negative result may occur with  improper specimen collection / handling, submission of specimen other  than nasopharyngeal swab, presence of viral mutation(s) within the  areas targeted by this assay, and inadequate number of viral copies  (<250 copies / mL). A negative result must be combined with clinical  observations, patient history, and epidemiological information. If result is POSITIVE SARS-CoV-2 target nucleic acids are DETECTED. The SARS-CoV-2 RNA is generally detectable in upper and lower  respiratory specimens dur ing the acute phase of infection.  Positive  results are indicative of active infection with SARS-CoV-2.  Clinical  correlation with patient history and other diagnostic information is  necessary to determine patient infection status.  Positive results do  not rule out bacterial infection or co-infection with other viruses. If result is PRESUMPTIVE POSTIVE SARS-CoV-2 nucleic acids MAY BE PRESENT.   A presumptive positive result was obtained on the submitted specimen  and confirmed on repeat testing.  While 2019 novel coronavirus  (SARS-CoV-2) nucleic acids may be present in the submitted sample  additional confirmatory testing may be necessary for epidemiological  and / or clinical management purposes  to differentiate between  SARS-CoV-2 and other Sarbecovirus currently known to infect humans.  If clinically indicated additional testing with an alternate test  methodology [phone]) is advised. The SARS-CoV-2 RNA is generally  detectable in upper and lower respiratory sp ecimens during the acute  phase of infection. The expected result is Negative. Fact Sheet for Patients:  BoilerBrush.com.cy Fact Sheet for Healthcare Providers: https://pope.com/ This test  is not yet approved or cleared by the [place] FDA and has been authorized for detection and/or diagnosis of SARS-CoV-2 by FDA under an Emergency Use Authorization (EUA).  This EUA will remain in effect (meaning this test can be used) for the duration of the COVID-19 declaration under Section 564(b)(1) of the Act, 21 U.S.C. section 360bbb-3(b)(1), unless the authorization is terminated or revoked sooner. Performed at [facility], [address], [place], [place] [address]   Blood Culture (routine x 2)     Status: None (Preliminary result)   Collection Time:  06/04/19 11:01 PM   Specimen: BLOOD RIGHT WRIST  Result Value Ref Range Status   Specimen Description   Final    BLOOD RIGHT WRIST Performed at [facility], [address] [place], [place] [address]    Special Requests   Final    BOTTLES DRAWN AEROBIC AND ANAEROBIC Blood Culture adequate volume Performed at [facility], [address] [place], [place] [address]    Culture   Final    NO GROWTH 2 DAYS Performed at [facility], [address] [place], [place] [address]    Report Status PENDING  Incomplete  Blood Culture (routine x 2)     Status: None (Preliminary result)   Collection Time: 06/04/19 11:01 PM   Specimen: BLOOD LEFT ARM  Result Value Ref Range Status   Specimen Description   Final    BLOOD LEFT ARM Performed at [facility], [address] [place], [place] [address]    Special Requests   Final    BOTTLES DRAWN AEROBIC AND ANAEROBIC Blood Culture adequate volume Performed at [facility], [address] [place], [place] [address]    Culture   Final    NO GROWTH 2 DAYS Performed at [facility], [address], [place], [place] [address]    Report Status PENDING  Incomplete  Respiratory Panel by PCR     Status: Abnormal   Collection Time: 06/05/19  3:38 AM   Specimen: Nasopharyngeal Swab; Respiratory  Result Value  Ref Range Status   Adenovirus NOT DETECTED NOT DETECTED Final   Coronavirus 229E NOT DETECTED NOT DETECTED Final    Comment: (NOTE) The Coronavirus on the Respiratory Panel, DOES NOT test for the novel  Coronavirus (2019 nCoV)    Coronavirus HKU1 NOT DETECTED NOT DETECTED Final   Coronavirus NL63 NOT DETECTED NOT DETECTED Final   Coronavirus OC43 NOT DETECTED NOT DETECTED Final   Metapneumovirus NOT DETECTED NOT DETECTED Final   Rhinovirus / Enterovirus DETECTED (A) NOT DETECTED Final   Influenza A NOT DETECTED NOT DETECTED Final   Influenza B NOT DETECTED NOT DETECTED Final   Parainfluenza Virus 1 NOT DETECTED NOT DETECTED Final   Parainfluenza Virus 2 NOT DETECTED NOT DETECTED Final   Parainfluenza Virus 3 NOT DETECTED NOT DETECTED Final   Parainfluenza Virus 4 NOT DETECTED NOT DETECTED Final   Respiratory Syncytial Virus NOT DETECTED NOT DETECTED Final   Bordetella pertussis NOT DETECTED NOT DETECTED Final   Chlamydophila pneumoniae NOT DETECTED NOT DETECTED Final   Mycoplasma pneumoniae NOT DETECTED NOT DETECTED Final    Comment: Performed at [facility], [address], [place], [place] [address]  SARS Coronavirus 2 by RT PCR (hospital order, performed in [facility] hospital lab) Nasopharyngeal Nasopharyngeal Swab     Status: None   Collection Time: 06/05/19 12:07 PM   Specimen: Nasopharyngeal Swab  Result Value Ref Range Status   SARS Coronavirus 2 NEGATIVE NEGATIVE Final    Comment: (NOTE) If result is NEGATIVE SARS-CoV-2 target nucleic acids are NOT DETECTED. The SARS-CoV-2 RNA is generally detectable in upper and lower  respiratory specimens during the acute phase of infection. The lowest  concentration of SARS-CoV-2 viral copies this assay can detect is 250  copies / mL. A negative result does not preclude SARS-CoV-2 infection  and should not be used as the sole basis for treatment or other  patient management decisions.  A negative result may occur with  improper specimen collection / handling, submission of specimen other  than nasopharyngeal swab, presence of viral mutation(s) within the  areas targeted by this assay, and inadequate number of viral copies  (<250 copies / mL). A negative result must be combined with clinical  observations, patient history, and epidemiological information. If result is POSITIVE SARS-CoV-2 target nucleic acids are DETECTED. The SARS-CoV-2 RNA is generally detectable in upper and lower  respiratory specimens dur ing the acute phase of infection.  Positive  results are indicative of active infection with SARS-CoV-2.  Clinical  correlation with patient history and other diagnostic information is  necessary to determine patient infection status.  Positive results do  not rule out bacterial infection or co-infection with other viruses. If result is PRESUMPTIVE POSTIVE SARS-CoV-2 nucleic acids MAY BE PRESENT.   A presumptive positive result was obtained on the submitted specimen  and confirmed on repeat testing.  While 2019 novel coronavirus  (SARS-CoV-2) nucleic acids may be present in the submitted sample  additional confirmatory testing may be necessary for epidemiological  and / or clinical management purposes  to differentiate between  SARS-CoV-2 and other Sarbecovirus currently known to infect humans.  If clinically indicated additional testing with an alternate test  methodology [phone]) is advised. The SARS-CoV-2 RNA is generally  detectable in upper and lower respiratory sp ecimens during the acute  phase of infection. The expected result is Negative. Fact Sheet for Patients:  StrictlyIdeas.no Fact Sheet for Healthcare Providers: BankingDealers.co.za This test is not yet approved or cleared by the [place] FDA and has been authorized for detection and/or diagnosis of SARS-CoV-2 by FDA under an Emergency Use Authorization (EUA).  This EUA will  remain in effect (meaning this test can be used) for the duration of the COVID-19 declaration under Section 564(b)(1) of the Act, 21 U.S.C. section 360bbb-3(b)(1), unless the authorization is terminated or revoked sooner. Performed at [facility], [place] [address], [place], [place] [address]   MRSA PCR Screening     Status: None   Collection Time: 06/06/19  1:00 AM   Specimen: Nasopharyngeal  Result Value Ref Range Status   MRSA by PCR NEGATIVE NEGATIVE Final    Comment:        The GeneXpert MRSA Assay (FDA approved for NASAL specimens only), is one component of a comprehensive MRSA colonization surveillance program. It is not intended to diagnose MRSA infection nor to guide or monitor treatment for MRSA infections. Performed at [facility], [place] [address], [place], [place] [address]          Radiology Studies: Dg Chest Port 1 View  Result Date: 06/06/2019 CLINICAL DATA:  Respiratory distress EXAM: PORTABLE CHEST 1 VIEW COMPARISON:  None. FINDINGS: The heart size and mediastinal contours are within normal limits. Both lungs are clear. The visualized skeletal structures are unremarkable. IMPRESSION: No active disease. Electronically Signed   By: Ulyses Jarred M.D.   On: 06/06/2019 04:48        Scheduled Meds: . amLODipine  10 mg Oral Daily  . aspirin EC  81 mg Oral Daily  . azithromycin  250 mg Oral Daily  . Chlorhexidine Gluconate Cloth  6 each Topical Daily  . enoxaparin (LOVENOX) injection  60 mg Subcutaneous Daily  . fluticasone  2 spray Each Nare Daily  . fluticasone furoate-vilanterol  1 puff Inhalation Daily  . loratadine  10 mg Oral QHS  . losartan  100 mg Oral Daily  . metoprolol succinate  100  mg Oral Daily  . montelukast  10 mg Oral QHS  . nicotine  14 mg Transdermal Daily  . pantoprazole  40 mg Oral Q0600  . potassium chloride  40 mEq Oral Once  . predniSONE  40 mg Oral Q breakfast  . rosuvastatin  40 mg Oral  q1800  . umeclidinium bromide  1 puff Inhalation Daily   Continuous Infusions:    LOS: 2 days    Time spent: 40 minutes    Ramiro Harvest, MD [facility]  If 7PM-7AM, please contact night-coverage [email or web address] 06/07/2019, 9:10 AM

## 2019-06-08 LAB — CBC WITH DIFFERENTIAL/PLATELET
Abs Immature Granulocytes: 0.09 10*3/uL — ABNORMAL HIGH (ref 0.00–0.07)
Basophils Absolute: 0.1 10*3/uL (ref 0.0–0.1)
Basophils Relative: 0 %
Eosinophils Absolute: 0.1 10*3/uL (ref 0.0–0.5)
Eosinophils Relative: 0 %
HCT: 41.5 % (ref 36.0–46.0)
Hemoglobin: 13.2 g/dL (ref 12.0–15.0)
Immature Granulocytes: 1 %
Lymphocytes Relative: 43 %
Lymphs Abs: 5.8 10*3/uL — ABNORMAL HIGH (ref 0.7–4.0)
MCH: 29.5 pg (ref 26.0–34.0)
MCHC: 31.8 g/dL (ref 30.0–36.0)
MCV: 92.6 fL (ref 80.0–100.0)
Monocytes Absolute: 1.3 10*3/uL — ABNORMAL HIGH (ref 0.1–1.0)
Monocytes Relative: 9 %
Neutro Abs: 6.4 10*3/uL (ref 1.7–7.7)
Neutrophils Relative %: 47 %
Platelets: 415 10*3/uL — ABNORMAL HIGH (ref 150–400)
RBC: 4.48 MIL/uL (ref 3.87–5.11)
RDW: 13.5 % (ref 11.5–15.5)
WBC: 13.7 10*3/uL — ABNORMAL HIGH (ref 4.0–10.5)
nRBC: 0 % (ref 0.0–0.2)

## 2019-06-08 LAB — BASIC METABOLIC PANEL
Anion gap: 7 (ref 5–15)
BUN: 22 mg/dL — ABNORMAL HIGH (ref 6–20)
CO2: 28 mmol/L (ref 22–32)
Calcium: 8.6 mg/dL — ABNORMAL LOW (ref 8.9–10.3)
Chloride: 103 mmol/L (ref 98–111)
Creatinine, Ser: 0.7 mg/dL (ref 0.44–1.00)
GFR calc Af Amer: >60 mL/min (ref >60)
GFR calc non Af Amer: >60 mL/min (ref >60)
Glucose, Bld: 98 mg/dL (ref 70–99)
Potassium: 3.6 mmol/L (ref 3.5–5.1)
Sodium: 138 mmol/L (ref 135–145)

## 2019-06-08 LAB — GLUCOSE, CAPILLARY: Glucose-Capillary: 115 mg/dL — ABNORMAL HIGH (ref 70–99)

## 2019-06-08 MED ORDER — LOSARTAN POTASSIUM 100 MG PO TABS: 100.0000 mg | ORAL_TABLET | Freq: Every day | ORAL | 1 refills | Status: DC

## 2019-06-08 MED ORDER — UMECLIDINIUM BROMIDE 62.5 MCG/INH IN AEPB: 1.0000 | INHALATION_SPRAY | Freq: Every day | RESPIRATORY_TRACT | 1 refills | Status: DC

## 2019-06-08 MED ORDER — FLUTICASONE FUROATE-VILANTEROL 200-25 MCG/INH IN AEPB: 1.0000 | INHALATION_SPRAY | Freq: Every day | RESPIRATORY_TRACT | 1 refills | Status: DC

## 2019-06-08 MED ORDER — NICOTINE 14 MG/24HR TD PT24: 14.0000 mg | MEDICATED_PATCH | Freq: Every day | TRANSDERMAL | 0 refills | Status: DC

## 2019-06-08 MED ORDER — METOPROLOL SUCCINATE ER 100 MG PO TB24: 100.0000 mg | ORAL_TABLET | Freq: Every day | ORAL | 1 refills | Status: DC

## 2019-06-08 MED ORDER — AMLODIPINE BESYLATE 10 MG PO TABS: 10.0000 mg | ORAL_TABLET | Freq: Every day | ORAL | 1 refills | Status: DC

## 2019-06-08 MED ORDER — AZITHROMYCIN 250 MG PO TABS: 250.0000 mg | ORAL_TABLET | Freq: Every day | ORAL | 0 refills | Status: DC

## 2019-06-08 MED ORDER — ALBUTEROL SULFATE HFA 108 (90 BASE) MCG/ACT IN AERS: 2.0000 | INHALATION_SPRAY | Freq: Four times a day (QID) | RESPIRATORY_TRACT | 0 refills | Status: DC | PRN

## 2019-06-08 MED ORDER — PANTOPRAZOLE SODIUM 40 MG PO TBEC: 40.0000 mg | DELAYED_RELEASE_TABLET | Freq: Every day | ORAL | 1 refills | Status: DC

## 2019-06-08 MED ORDER — PREDNISONE 20 MG PO TABS: 40.0000 mg | ORAL_TABLET | Freq: Every day | ORAL | 0 refills | Status: AC

## 2019-06-08 MED ORDER — FLUTICASONE PROPIONATE 50 MCG/ACT NA SUSP: 2.0000 | Freq: Every day | NASAL | 0 refills | Status: DC

## 2019-06-08 NOTE — Discharge Summary (Signed)
Physician Discharge Summary  Julia Ingram ZOX:[id number] DOB: 04-04-1976 DOA: 06/04/2019  PCP: Patient, No Pcp Per  Admit date: 06/04/2019 Discharge date: 06/08/2019  Time spent: 60 minutes  Recommendations for Outpatient Follow-up:  1. Follow-up with [facility] in 2 to 3 weeks.   Discharge Diagnoses:  Principal Problem:   Acute respiratory distress Active Problems:   Acute asthma exacerbation   Essential hypertension   Morbid (severe) obesity due to excess calories (HCC)   Hypokalemia   Acute respiratory failure (HCC)   Vocal cord paralysis   Tobacco abuse   CAD (coronary artery disease)   Discharge Condition: Stable and improved  Diet recommendation: Heart healthy  Filed Weights   06/05/19 0048 06/06/19 0100 06/07/19 0600  Weight: 132.9 kg 134.7 kg 131.8 kg    History of present illness:  HPI per Julia Ingram is a 43 y.o. female with medical history significant of HTN, CAD s/p PCI X2, morbid obesity. Patient presented to the ED with c/o SOB.  Had preceding nasal congestion and postnasal drip, sinus pressure.  Saw doctor a few days ago for this, prescribed amoxicillin.  Cough onset 1 day prior to admission, cough was non-productive.  Increasing SOB last night.  This had progressively worsened.  Mild pleuritic discomfort with coughing only.  No CP.  No known sick contacts, syncope, leg swelling, loss of smell or taste.  Has history of "asthma" in the past but this hasnt bothered her for over 15 years she estimates.   ED Course: Tm 100.4, HR 100-110s after albuterol.  Satting 96% on 2L.  D.Dimer 2.59, WBC 13.6k  CTA: 1) non-diagnostic for PE due to contrast timing, though no large / central PE seen 2) No PNA or other lung findings  Procalcitonin negative.  Given albuterol treatments, steroids.  Hospital Course:  1 acute respiratory distress/rhinovirus Secondary to rhinovirus induced croup and bronchospasms in the  setting of underlying vocal cord paralysis and asthma. Patient on admission was in acute respiratory distress with poor air movement, diffuse wheezing, speaking in choppy sentences and use of accessory muscles of respiration.  SARS Covid 2  Negative x2.  Respiratory viral panel positive for rhinovirus/enterovirus.  Repeat chest x-ray done without any acute infiltrate.  CT angiogram chest with limited evaluation however no definite CT evidence of large central pulmonary artery embolus. Bilateral lower lobe subsegmental and linear atelectasis.  Patient critical care medicine was consulted and assessed the patient.  Patient was placed on IV Solu-Medrol, nebulizer treatments, IV antibiotics.  Patient improved clinically during the hospitalization.  Patient was subsequently weaned off oxygen.  IV Solu-Medrol was transitioned to oral prednisone and patient nebulizer treatments changed to [employer] and [employer].  Patient was also placed on Singulair and a PPI.  IV antibiotics were subsequently transition to oral azithromycin.  Pulmonary hygiene.  Patient improved clinically and be discharged home in stable and improved condition.  Patient is to follow-up with the ENT and pulmonologist in the outpatient setting with [facility] in [place].   2.  Vocal cord paralysis Status post multiple surgeries by ENT until patient noted to have lost her insurance.  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  Will need outpatient follow-up.  Patient follows with [facility] in [place].  3.  Tobacco abuse Tobacco cessation.   Patient placed on a nicotine patch.  4.  Hypertension Patient maintained on home regimen of Toprol-XL, ACE inhibitor was changed to ARB and Norvasc added.  Blood pressure improved.  Outpatient follow-up  with PCP.   5.  Hypokalemia Likely secondary to nebulizers.  Patient denied any diarrhea or emesis.    Potassium was repleted and hypokalemia had resolved by day of discharge.  6.  Morbid obesity  7.   Coronary artery disease status post PCI Remained stable during the hospitalization.  Patient was maintained on Crestor and Toprol-XL. ACE inhibitor changed to ARB secondary to problem #1.  Patient started on Norvasc for better blood pressure control.  Outpatient follow-up with PCP.   Procedures:  CT angiogram 06/05/2019  Chest x-ray 06/04/2019, 06/06/2019  Consultations:  PCCM: Dr. Katrinka Blazing 06/05/2019  Discharge Exam: Vitals:   06/08/19 1155 06/08/19 1156  BP: 129/72 129/72  Pulse:  78  Resp:    Temp:    SpO2:      General: NAD Cardiovascular: RRR Respiratory: CTAB  Discharge Instructions   Discharge Instructions    Diet - low sodium heart healthy   Complete by: As directed    Increase activity slowly   Complete by: As directed      Allergies as of 06/08/2019      Reactions   Demerol  [meperidine Hcl] Hives   Magnesium Sulfate Anaphylaxis   Per patient on 05/16/15, said her heart stopped when she received this medication.   Meperidine Hives   Gadolinium Derivatives Hives, Nausea And Vomiting   Pirbuterol Other (See Comments)   Headache      Medication List    STOP taking these medications   amoxicillin 500 MG capsule Commonly known as: AMOXIL   lisinopril 10 MG tablet Commonly known as: ZESTRIL   lisinopril 40 MG tablet Commonly known as: ZESTRIL     TAKE these medications   albuterol 108 (90 Base) MCG/ACT inhaler Commonly known as: VENTOLIN HFA Inhale 2 puffs into the lungs every 6 (six) hours as needed for wheezing or shortness of breath.   amLODipine 10 MG tablet Commonly known as: NORVASC Take 1 tablet (10 mg total) by mouth daily. Start taking on: June 09, 2019   aspirin EC 81 MG tablet Take 81 mg by mouth daily.   aspirin-acetaminophen-caffeine 250-250-65 MG tablet Commonly known as: EXCEDRIN MIGRAINE Take 2 tablets by mouth every 6 (six) hours as needed for headache.   azithromycin 250 MG tablet Commonly known as: ZITHROMAX Take  1 tablet (250 mg total) by mouth daily for 2 days. Start taking on: June 09, 2019   cholecalciferol 25 MCG (1000 UT) tablet Commonly known as: VITAMIN D3 Take by mouth.   Claritin 10 MG tablet Generic drug: loratadine Take 10 mg by mouth at bedtime.   fluticasone 50 MCG/ACT nasal spray Commonly known as: FLONASE Place 2 sprays into both nostrils daily.   fluticasone furoate-vilanterol 200-25 MCG/INH Aepb Commonly known as: BREO ELLIPTA Inhale 1 puff into the lungs daily. Start taking on: June 09, 2019   losartan 100 MG tablet Commonly known as: COZAAR Take 1 tablet (100 mg total) by mouth daily. Start taking on: June 09, 2019   metoprolol succinate 100 MG 24 hr tablet Commonly known as: TOPROL-XL Take 1 tablet (100 mg total) by mouth daily. What changed: Another medication with the same name was removed. Continue taking this medication, and follow the directions you see here.   nicotine 14 mg/24hr patch Commonly known as: NICODERM CQ - dosed in mg/24 hours Place 1 patch (14 mg total) onto the skin daily. Start taking on: June 09, 2019   pantoprazole 40 MG tablet Commonly known as: PROTONIX Take 1 tablet (  40 mg total) by mouth daily at 6 (six) AM. Start taking on: June 09, 2019   predniSONE 20 MG tablet Commonly known as: DELTASONE Take 2 tablets (40 mg total) by mouth daily with breakfast for 3 days. Start taking on: June 09, 2019   rosuvastatin 40 MG tablet Commonly known as: CRESTOR Take 40 mg by mouth daily.   umeclidinium bromide 62.5 MCG/INH Aepb Commonly known as: INCRUSE ELLIPTA Inhale 1 puff into the lungs daily. Start taking on: June 09, 2019      Allergies  Allergen Reactions  . Demerol  [Meperidine Hcl] Hives  . Magnesium Sulfate Anaphylaxis    Per patient on 05/16/15, said her heart stopped when she received this medication.  . Meperidine Hives  . Gadolinium Derivatives Hives and Nausea And Vomiting  . Pirbuterol Other  (See Comments)    Headache    Follow-up Information    PCP. Schedule an appointment as soon as possible for a visit in 2 week(s).        [facility]. Schedule an appointment as soon as possible for a visit in 2 week(s).   Why: F/U IN 2-3 WEEKS.           The results of significant diagnostics from this hospitalization (including imaging, microbiology, ancillary and laboratory) are listed below for reference.    Significant Diagnostic Studies: Dg Chest 2 View  Result Date: 06/04/2019 CLINICAL DATA:  Shortness of breath EXAM: CHEST - 2 VIEW COMPARISON:  10/15/2018 FINDINGS: The heart size and mediastinal contours are within normal limits. Both lungs are clear. The visualized skeletal structures are unremarkable. IMPRESSION: No active cardiopulmonary disease. Electronically Signed   By: Deatra Robinson M.D.   On: 06/04/2019 21:06   Ct Angio Chest Pe W And/or Wo Contrast  Result Date: 06/05/2019 CLINICAL DATA:  43 year old female with shortness of breath. Concern for pulmonary embolism. EXAM: CT ANGIOGRAPHY CHEST WITH CONTRAST TECHNIQUE: Multidetector CT imaging of the chest was performed using the standard protocol during bolus administration of intravenous contrast. Multiplanar CT image reconstructions and MIPs were obtained to evaluate the vascular anatomy. CONTRAST:  OMNIPAQUE IOHEXOL 350 MG/ML SOLN COMPARISON:  Chest radiograph dated 06/04/2019 and CT dated 06/01/2018. FINDINGS: Evaluation is limited due to patient's body habitus and respiratory motion artifact. Evaluation is also limited due to suboptimal opacification of the pulmonary arteries and timing of the contrast. Cardiovascular: Borderline cardiomegaly. No pericardial effusion. The thoracic aorta is unremarkable. The visualized origins of the great vessels of the aortic arch appear patent. Evaluation of the pulmonary arteries is very limited, almost nondiagnostic due to the factors above. No large or saddle  embolus identified in the pulmonary trunk or main pulmonary arteries. If there is high clinical concern for acute PE, V/Q scan may provide better evaluation. Mediastinum/Nodes: There is no hilar or mediastinal adenopathy. The esophagus is grossly unremarkable. No mediastinal fluid collection. Lungs/Pleura: Bilateral lower lobe subsegmental and linear atelectasis noted. No lobar consolidation, pleural effusion, or pneumothorax. The central airways are patent. Upper Abdomen: No acute abnormality. Musculoskeletal: No chest wall abnormality. No acute or significant osseous findings. Review of the MIP images confirms the above findings. IMPRESSION: 1. No definite CT evidence of large central pulmonary artery embolus. Evaluation is very limited, almost nondiagnostic, due to patient's body habitus and respiratory motion artifact. If there is high clinical concern for acute PE, V/Q scan may provide better evaluation. 2. Bilateral lower lobe subsegmental and linear atelectasis. Electronically Signed   By: Burtis Junes  Radparvar M.D.   On: 06/05/2019 03:12   Dg Chest Port 1 View  Result Date: 06/06/2019 CLINICAL DATA:  Respiratory distress EXAM: PORTABLE CHEST 1 VIEW COMPARISON:  None. FINDINGS: The heart size and mediastinal contours are within normal limits. Both lungs are clear. The visualized skeletal structures are unremarkable. IMPRESSION: No active disease. Electronically Signed   By: Deatra Robinson M.D.   On: 06/06/2019 04:48    Microbiology: Recent Results (from the past 240 hour(s))  SARS Coronavirus 2 by RT PCR (hospital order, performed in [facility] hospital lab) Nasopharyngeal Nasopharyngeal Swab     Status: None   Collection Time: 06/04/19 11:00 PM   Specimen: Nasopharyngeal Swab  Result Value Ref Range Status   SARS Coronavirus 2 NEGATIVE NEGATIVE Final    Comment: (NOTE) If result is NEGATIVE SARS-CoV-2 target nucleic acids are NOT DETECTED. The SARS-CoV-2 RNA is generally detectable in upper and  lower  respiratory specimens during the acute phase of infection. The lowest  concentration of SARS-CoV-2 viral copies this assay can detect is 250  copies / mL. A negative result does not preclude SARS-CoV-2 infection  and should not be used as the sole basis for treatment or other  patient management decisions.  A negative result may occur with  improper specimen collection / handling, submission of specimen other  than nasopharyngeal swab, presence of viral mutation(s) within the  areas targeted by this assay, and inadequate number of viral copies  (<250 copies / mL). A negative result must be combined with clinical  observations, patient history, and epidemiological information. If result is POSITIVE SARS-CoV-2 target nucleic acids are DETECTED. The SARS-CoV-2 RNA is generally detectable in upper and lower  respiratory specimens dur ing the acute phase of infection.  Positive  results are indicative of active infection with SARS-CoV-2.  Clinical  correlation with patient history and other diagnostic information is  necessary to determine patient infection status.  Positive results do  not rule out bacterial infection or co-infection with other viruses. If result is PRESUMPTIVE POSTIVE SARS-CoV-2 nucleic acids MAY BE PRESENT.   A presumptive positive result was obtained on the submitted specimen  and confirmed on repeat testing.  While 2019 novel coronavirus  (SARS-CoV-2) nucleic acids may be present in the submitted sample  additional confirmatory testing may be necessary for epidemiological  and / or clinical management purposes  to differentiate between  SARS-CoV-2 and other Sarbecovirus currently known to infect humans.  If clinically indicated additional testing with an alternate test  methodology [phone]) is advised. The SARS-CoV-2 RNA is generally  detectable in upper and lower respiratory sp ecimens during the acute  phase of infection. The expected result is  Negative. Fact Sheet for Patients:  BoilerBrush.com.cy Fact Sheet for Healthcare Providers: https://pope.com/ This test is not yet approved or cleared by the [place] FDA and has been authorized for detection and/or diagnosis of SARS-CoV-2 by FDA under an Emergency Use Authorization (EUA).  This EUA will remain in effect (meaning this test can be used) for the duration of the COVID-19 declaration under Section 564(b)(1) of the Act, 21 U.S.C. section 360bbb-3(b)(1), unless the authorization is terminated or revoked sooner. Performed at [facility], [address], [place], [place] [address]   Blood Culture (routine x 2)     Status: None (Preliminary result)   Collection Time: 06/04/19 11:01 PM   Specimen: BLOOD RIGHT WRIST  Result Value Ref Range Status   Specimen Description   Final  BLOOD RIGHT WRIST Performed at [facility], [place] [address] [place], [place] [address]    Special Requests   Final    BOTTLES DRAWN AEROBIC AND ANAEROBIC Blood Culture adequate volume Performed at [facility] [address], [place], [place] [address]    Culture   Final    NO GROWTH 3 DAYS Performed at [facility], [place] [address] [place], [place] [address]    Report Status PENDING  Incomplete  Blood Culture (routine x 2)     Status: None (Preliminary result)   Collection Time: 06/04/19 11:01 PM   Specimen: BLOOD LEFT ARM  Result Value Ref Range Status   Specimen Description   Final    BLOOD LEFT ARM Performed at [facility], [place] [address] [place], [place] [address]    Special Requests   Final    BOTTLES DRAWN AEROBIC AND ANAEROBIC Blood Culture adequate volume Performed at [facility] [address], [place], [place] [address]    Culture   Final    NO GROWTH 3 DAYS Performed at [facility], [place] [address] [place], [place] [address]    Report  Status PENDING  Incomplete  Respiratory Panel by PCR     Status: Abnormal   Collection Time: 06/05/19  3:38 AM   Specimen: Nasopharyngeal Swab; Respiratory  Result Value Ref Range Status   Adenovirus NOT DETECTED NOT DETECTED Final   Coronavirus 229E NOT DETECTED NOT DETECTED Final    Comment: (NOTE) The Coronavirus on the Respiratory Panel, DOES NOT test for the novel  Coronavirus (2019 nCoV)    Coronavirus HKU1 NOT DETECTED NOT DETECTED Final   Coronavirus NL63 NOT DETECTED NOT DETECTED Final   Coronavirus OC43 NOT DETECTED NOT DETECTED Final   Metapneumovirus NOT DETECTED NOT DETECTED Final   Rhinovirus / Enterovirus DETECTED (A) NOT DETECTED Final   Influenza A NOT DETECTED NOT DETECTED Final   Influenza B NOT DETECTED NOT DETECTED Final   Parainfluenza Virus 1 NOT DETECTED NOT DETECTED Final   Parainfluenza Virus 2 NOT DETECTED NOT DETECTED Final   Parainfluenza Virus 3 NOT DETECTED NOT DETECTED Final   Parainfluenza Virus 4 NOT DETECTED NOT DETECTED Final   Respiratory Syncytial Virus NOT DETECTED NOT DETECTED Final   Bordetella pertussis NOT DETECTED NOT DETECTED Final   Chlamydophila pneumoniae NOT DETECTED NOT DETECTED Final   Mycoplasma pneumoniae NOT DETECTED NOT DETECTED Final    Comment: Performed at [facility], [place]. [address] [place], [place] [address]  SARS Coronavirus 2 by RT PCR (hospital order, performed in [facility] hospital lab) Nasopharyngeal Nasopharyngeal Swab     Status: None   Collection Time: 06/05/19 12:07 PM   Specimen: Nasopharyngeal Swab  Result Value Ref Range Status   SARS Coronavirus 2 NEGATIVE NEGATIVE Final    Comment: (NOTE) If result is NEGATIVE SARS-CoV-2 target nucleic acids are NOT DETECTED. The SARS-CoV-2 RNA is generally detectable in upper and lower  respiratory specimens during the acute phase of infection. The lowest  concentration of SARS-CoV-2 viral copies this assay can detect is 250  copies / mL. A negative result does  not preclude SARS-CoV-2 infection  and should not be used as the sole basis for treatment or other  patient management decisions.  A negative result may occur with  improper specimen collection / handling, submission of specimen other  than nasopharyngeal swab, presence of viral mutation(s) within the  areas targeted by  this assay, and inadequate number of viral copies  (<250 copies / mL). A negative result must be combined with clinical  observations, patient history, and epidemiological information. If result is POSITIVE SARS-CoV-2 target nucleic acids are DETECTED. The SARS-CoV-2 RNA is generally detectable in upper and lower  respiratory specimens dur ing the acute phase of infection.  Positive  results are indicative of active infection with SARS-CoV-2.  Clinical  correlation with patient history and other diagnostic information is  necessary to determine patient infection status.  Positive results do  not rule out bacterial infection or co-infection with other viruses. If result is PRESUMPTIVE POSTIVE SARS-CoV-2 nucleic acids MAY BE PRESENT.   A presumptive positive result was obtained on the submitted specimen  and confirmed on repeat testing.  While 2019 novel coronavirus  (SARS-CoV-2) nucleic acids may be present in the submitted sample  additional confirmatory testing may be necessary for epidemiological  and / or clinical management purposes  to differentiate between  SARS-CoV-2 and other Sarbecovirus currently known to infect humans.  If clinically indicated additional testing with an alternate test  methodology [phone] is advised. The SARS-CoV-2 RNA is generally  detectable in upper and lower respiratory sp ecimens during the acute  phase of infection. The expected result is Negative. Fact Sheet for Patients:  BoilerBrush.com.cy Fact Sheet for Healthcare Providers: https://pope.com/ This test is not yet approved or  cleared by the [place] FDA and has been authorized for detection and/or diagnosis of SARS-CoV-2 by FDA under an Emergency Use Authorization (EUA).  This EUA will remain in effect (meaning this test can be used) for the duration of the COVID-19 declaration under Section 564(b)(1) of the Act, 21 U.S.C. section 360bbb-3(b)(1), unless the authorization is terminated or revoked sooner. Performed at [facility], [address], [place], [place] [address]   MRSA PCR Screening     Status: None   Collection Time: 06/06/19  1:00 AM   Specimen: Nasopharyngeal  Result Value Ref Range Status   MRSA by PCR NEGATIVE NEGATIVE Final    Comment:        The GeneXpert MRSA Assay (FDA approved for NASAL specimens only), is one component of a comprehensive MRSA colonization surveillance program. It is not intended to diagnose MRSA infection nor to guide or monitor treatment for MRSA infections. Performed at [facility], [address] [place], [place] [address]      Labs: Basic Metabolic Panel: Recent Labs  Lab 06/05/19 0422 06/05/19 0630 06/05/19 1511 06/06/19 0231 06/07/19 0623 06/08/19 0845  NA 139  --  142 139 142 138  K 2.6*  --  3.8 3.6 3.5 3.6  CL 103  --  113* 106 108 103  CO2 24  --  GLUCOSE 272*  --  138* 182* 101* 98  BUN 11  --  22*  CREATININE 0.83  --  0.53 0.63 0.56 0.70  CALCIUM 8.6*  --  8.3* 9.4 8.6* 8.6*  MG  --  1.9  --  2.2 2.2  --    Liver Function Tests: Recent Labs  Lab 06/04/19 2300 06/06/19 0231  AST 15 16  ALT 13 14  ALKPHOS 100 95  BILITOT 0.4 0.3  PROT 7.4 7.7  ALBUMIN 3.5 3.4*   No results for input(s): LIPASE, AMYLASE in the last 168 hours. No results for input(s): AMMONIA in the last 168 hours. CBC: Recent Labs  Lab 06/04/19 2300 06/05/19 0432  06/06/19 0231 06/07/19 0623 06/08/19 0845  WBC 13.6* 12.7* 18.3* 17.0* 13.7*  NEUTROABS 9.6*  --  16.1* 11.1* 6.4  HGB 12.1  12.4 12.1 12.6 13.2  HCT 37.7 38.3 37.1 39.3 41.5  MCV 91.3 92.5 91.6 92.5 92.6  PLT 376 372 395 440* 415*   Cardiac Enzymes: No results for input(s): CKTOTAL, CKMB, CKMBINDEX, TROPONINI in the last 168 hours. BNP: BNP (last 3 results) Recent Labs    10/15/18 1520 06/06/19 0231  BNP 34.5 88.6    ProBNP (last 3 results) No results for input(s): PROBNP in the last 8760 hours.  CBG: Recent Labs  Lab 06/06/19 2155 06/07/19 0821 06/07/19 1252 06/07/19 2102 06/08/19 1141  GLUCAP 116* 108* 171* 114* 115*       Signed:  Ramiro Harvestaniel Thompson MD.  [facility] 06/08/2019, 3:20 PM

## 2019-06-08 NOTE — Discharge Instructions (Signed)
Acute Respiratory Failure, Adult ° °Acute respiratory failure occurs when there is not enough oxygen passing from your lungs to your body. When this happens, your lungs have trouble removing carbon dioxide from the blood. This causes your blood oxygen level to drop too low as carbon dioxide builds up. °Acute respiratory failure is a medical emergency. It can develop quickly, but it is temporary if treated promptly. Your lung capacity, or how much air your lungs can hold, may improve with time, exercise, and treatment. °What are the causes? °There are many possible causes of acute respiratory failure, including: °· Lung injury. °· Chest injury or damage to the ribs or tissues near the lungs. °· Lung conditions that affect the flow of air and blood into and out of the lungs, such as pneumonia, acute respiratory distress syndrome, and cystic fibrosis. °· Medical conditions, such as strokes or spinal cord injuries, that affect the muscles and nerves that control breathing. °· Blood infection (sepsis). °· Inflammation of the pancreas (pancreatitis). °· A blood clot in the lungs (pulmonary embolism). °· A large-volume blood transfusion. °· Burns. °· Near-drowning. °· Seizure. °· Smoke inhalation. °· Reaction to medicines. °· Alcohol or drug overdose. °What increases the risk? °This condition is more likely to develop in people who have: °· A blocked airway. °· Asthma. °· A condition or disease that damages or weakens the muscles, nerves, bones, or tissues that are involved in breathing. °· A serious infection. °· A health problem that blocks the unconscious reflex that is involved in breathing, such as hypothyroidism or sleep apnea. °· A lung injury or trauma. °What are the signs or symptoms? °Trouble breathing is the main symptom of acute respiratory failure. Symptoms may also include: °· Rapid breathing. °· Restlessness or anxiety. °· Skin, lips, or fingernails that appear blue (cyanosis). °· Rapid heart  rate. °· Abnormal heart rhythms (arrhythmias). °· Confusion or changes in behavior. °· Tiredness or loss of energy. °· Feeling sleepy or having a loss of consciousness. °How is this diagnosed? °Your health care provider can diagnose acute respiratory failure with a medical history and physical exam. During the exam, your health care provider will listen to your heart and check for crackling or wheezing sounds in your lungs. Your may also have tests to confirm the diagnosis and determine what is causing respiratory failure. These tests may include: °· Measuring the amount of oxygen in your blood (pulse oximetry). The measurement comes from a small device that is placed on your finger, earlobe, or toe. °· Other blood tests to measure blood gases and to look for signs of infection. °· Sampling your cerebral spinal fluid or tracheal fluid to check for infections. °· Chest X-Dansby to look for fluid in spaces that should be filled with air. °· Electrocardiogram (ECG) to look at the heart's electrical activity. °How is this treated? °Treatment for this condition usually takes places in a hospital intensive care unit (ICU). Treatment depends on what is causing the condition. It may include one or more treatments until your symptoms improve. Treatment may include: °· Supplemental oxygen. Extra oxygen is given through a tube in the nose, a face mask, or a hood. °· A device such as a continuous positive airway pressure (CPAP) or bi-level positive airway pressure (BiPAP or BPAP) machine. This treatment uses mild air pressure to keep the airways open. A mask or other device will be placed over your nose or mouth. A tube that is connected to a motor will deliver oxygen through the   mask.  Ventilator. This treatment helps move air into and out of the lungs. This may be done with a bag and mask or a machine. For this treatment, a tube is placed in your windpipe (trachea) so air and oxygen can flow to the lungs.  Extracorporeal  membrane oxygenation (ECMO). This treatment temporarily takes over the function of the heart and lungs, supplying oxygen and removing carbon dioxide. ECMO gives the lungs a chance to recover. It may be used if a ventilator is not effective.  Tracheostomy. This is a procedure that creates a hole in the neck to insert a breathing tube.  Receiving fluids and medicines.  Rocking the bed to help breathing. Follow these instructions at home:  Take over-the-counter and prescription medicines only as told by your health care provider.  Return to normal activities as told by your health care provider. Ask your health care provider what activities are safe for you.  Keep all follow-up visits as told by your health care provider. This is important. How is this prevented? Treating infections and medical conditions that may lead to acute respiratory failure can help prevent the condition from developing. Contact a health care provider if:  You have a fever.  Your symptoms do not improve or they get worse. Get help right away if:  You are having trouble breathing.  You lose consciousness.  Your have cyanosis or turn blue.  You develop a rapid heart rate.  You are confused. These symptoms may represent a serious problem that is an emergency. Do not wait to see if the symptoms will go away. Get medical help right away. Call your local emergency services (911 in the [place] Do not drive yourself to the hospital. This information is not intended to replace advice given to you by your health care provider. Make sure you discuss any questions you have with your health care provider. Document Released: 07/28/2013 Document Revised: 07/05/2017 Document Reviewed: 02/08/2016 Elsevier Patient Education  2020 [employer]   Acute Respiratory Failure, Adult  Acute respiratory failure occurs when there is not enough oxygen passing from your lungs to your body. When this happens, your lungs have trouble  removing carbon dioxide from the blood. This causes your blood oxygen level to drop too low as carbon dioxide builds up. Acute respiratory failure is a medical emergency. It can develop quickly, but it is temporary if treated promptly. Your lung capacity, or how much air your lungs can hold, may improve with time, exercise, and treatment. What are the causes? There are many possible causes of acute respiratory failure, including:  Lung injury.  Chest injury or damage to the ribs or tissues near the lungs.  Lung conditions that affect the flow of air and blood into and out of the lungs, such as pneumonia, acute respiratory distress syndrome, and cystic fibrosis.  Medical conditions, such as strokes or spinal cord injuries, that affect the muscles and nerves that control breathing.  Blood infection (sepsis).  Inflammation of the pancreas (pancreatitis).  A blood clot in the lungs (pulmonary embolism).  A large-volume blood transfusion.  Burns.  Near-drowning.  Seizure.  Smoke inhalation.  Reaction to medicines.  Alcohol or drug overdose. What increases the risk? This condition is more likely to develop in people who have:  A blocked airway.  Asthma.  A condition or disease that damages or weakens the muscles, nerves, bones, or tissues that are involved in breathing.  A serious infection.  A health problem that blocks the unconscious  reflex that is involved in breathing, such as hypothyroidism or sleep apnea.  A lung injury or trauma. What are the signs or symptoms? Trouble breathing is the main symptom of acute respiratory failure. Symptoms may also include:  Rapid breathing.  Restlessness or anxiety.  Skin, lips, or fingernails that appear blue (cyanosis).  Rapid heart rate.  Abnormal heart rhythms (arrhythmias).  Confusion or changes in behavior.  Tiredness or loss of energy.  Feeling sleepy or having a loss of consciousness. How is this  diagnosed? Your health care provider can diagnose acute respiratory failure with a medical history and physical exam. During the exam, your health care provider will listen to your heart and check for crackling or wheezing sounds in your lungs. Your may also have tests to confirm the diagnosis and determine what is causing respiratory failure. These tests may include:  Measuring the amount of oxygen in your blood (pulse oximetry). The measurement comes from a small device that is placed on your finger, earlobe, or toe.  Other blood tests to measure blood gases and to look for signs of infection.  Sampling your cerebral spinal fluid or tracheal fluid to check for infections.  Chest X-ray to look for fluid in spaces that should be filled with air.  Electrocardiogram (ECG) to look at the heart's electrical activity. How is this treated? Treatment for this condition usually takes places in a hospital intensive care unit (ICU). Treatment depends on what is causing the condition. It may include one or more treatments until your symptoms improve. Treatment may include:  Supplemental oxygen. Extra oxygen is given through a tube in the nose, a face mask, or a hood.  A device such as a continuous positive airway pressure (CPAP) or bi-level positive airway pressure (BiPAP or BPAP) machine. This treatment uses mild air pressure to keep the airways open. A mask or other device will be placed over your nose or mouth. A tube that is connected to a motor will deliver oxygen through the mask.  Ventilator. This treatment helps move air into and out of the lungs. This may be done with a bag and mask or a machine. For this treatment, a tube is placed in your windpipe (trachea) so air and oxygen can flow to the lungs.  Extracorporeal membrane oxygenation (ECMO). This treatment temporarily takes over the function of the heart and lungs, supplying oxygen and removing carbon dioxide. ECMO gives the lungs a chance to  recover. It may be used if a ventilator is not effective.  Tracheostomy. This is a procedure that creates a hole in the neck to insert a breathing tube.  Receiving fluids and medicines.  Rocking the bed to help breathing. Follow these instructions at home:  Take over-the-counter and prescription medicines only as told by your health care provider.  Return to normal activities as told by your health care provider. Ask your health care provider what activities are safe for you.  Keep all follow-up visits as told by your health care provider. This is important. How is this prevented? Treating infections and medical conditions that may lead to acute respiratory failure can help prevent the condition from developing. Contact a health care provider if:  You have a fever.  Your symptoms do not improve or they get worse. Get help right away if:  You are having trouble breathing.  You lose consciousness.  Your have cyanosis or turn blue.  You develop a rapid heart rate.  You are confused. These symptoms may represent a  serious problem that is an emergency. Do not wait to see if the symptoms will go away. Get medical help right away. Call your local emergency services (911 in the [place] Do not drive yourself to the hospital. This information is not intended to replace advice given to you by your health care provider. Make sure you discuss any questions you have with your health care provider. Document Released: 07/28/2013 Document Revised: 07/05/2017 Document Reviewed: 02/08/2016 Elsevier Patient Education  2020 [employer].

## 2019-06-08 NOTE — Progress Notes (Signed)
Discharged instructions explained to patient. Prescriptions also given to patient. Julia Ingram states not having any questions. IV discontinued by Festus Holts. Patient refused to take a wheelchair to the ER. She stated she drove herself to the Ed and she was going to drive herself home.

## 2019-06-10 LAB — CULTURE, BLOOD (ROUTINE X 2)
Culture: NO GROWTH
Culture: NO GROWTH
Special Requests: ADEQUATE
Special Requests: ADEQUATE

## 2020-08-03 IMAGING — CR DG CHEST 2V
2 series · 2 of 2 positions shown · non-contrast
Comparison: February 28, 2018

CLINICAL DATA: Shortness of breath

EXAM:
CHEST - 2 VIEW

[chest pa]
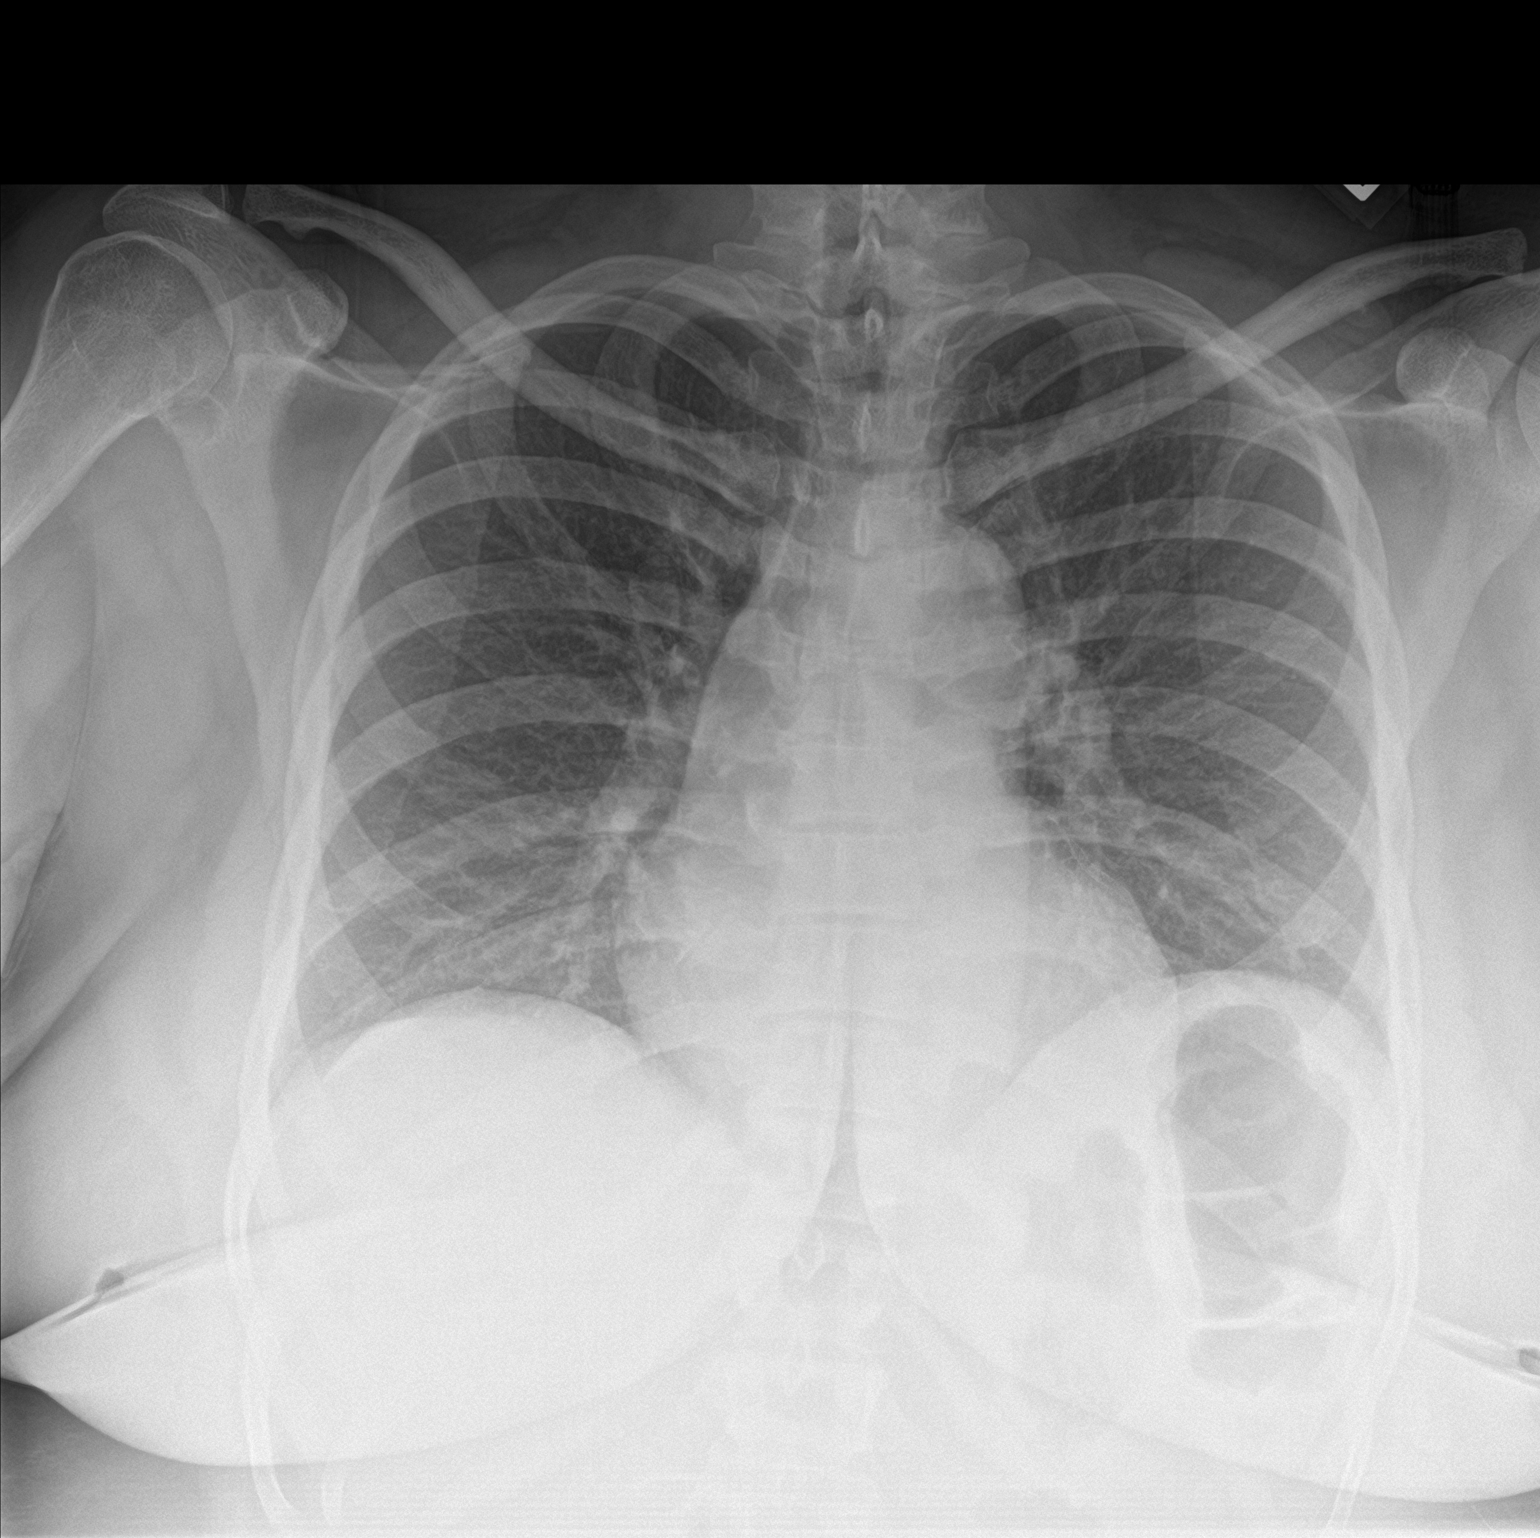

[chest lat]
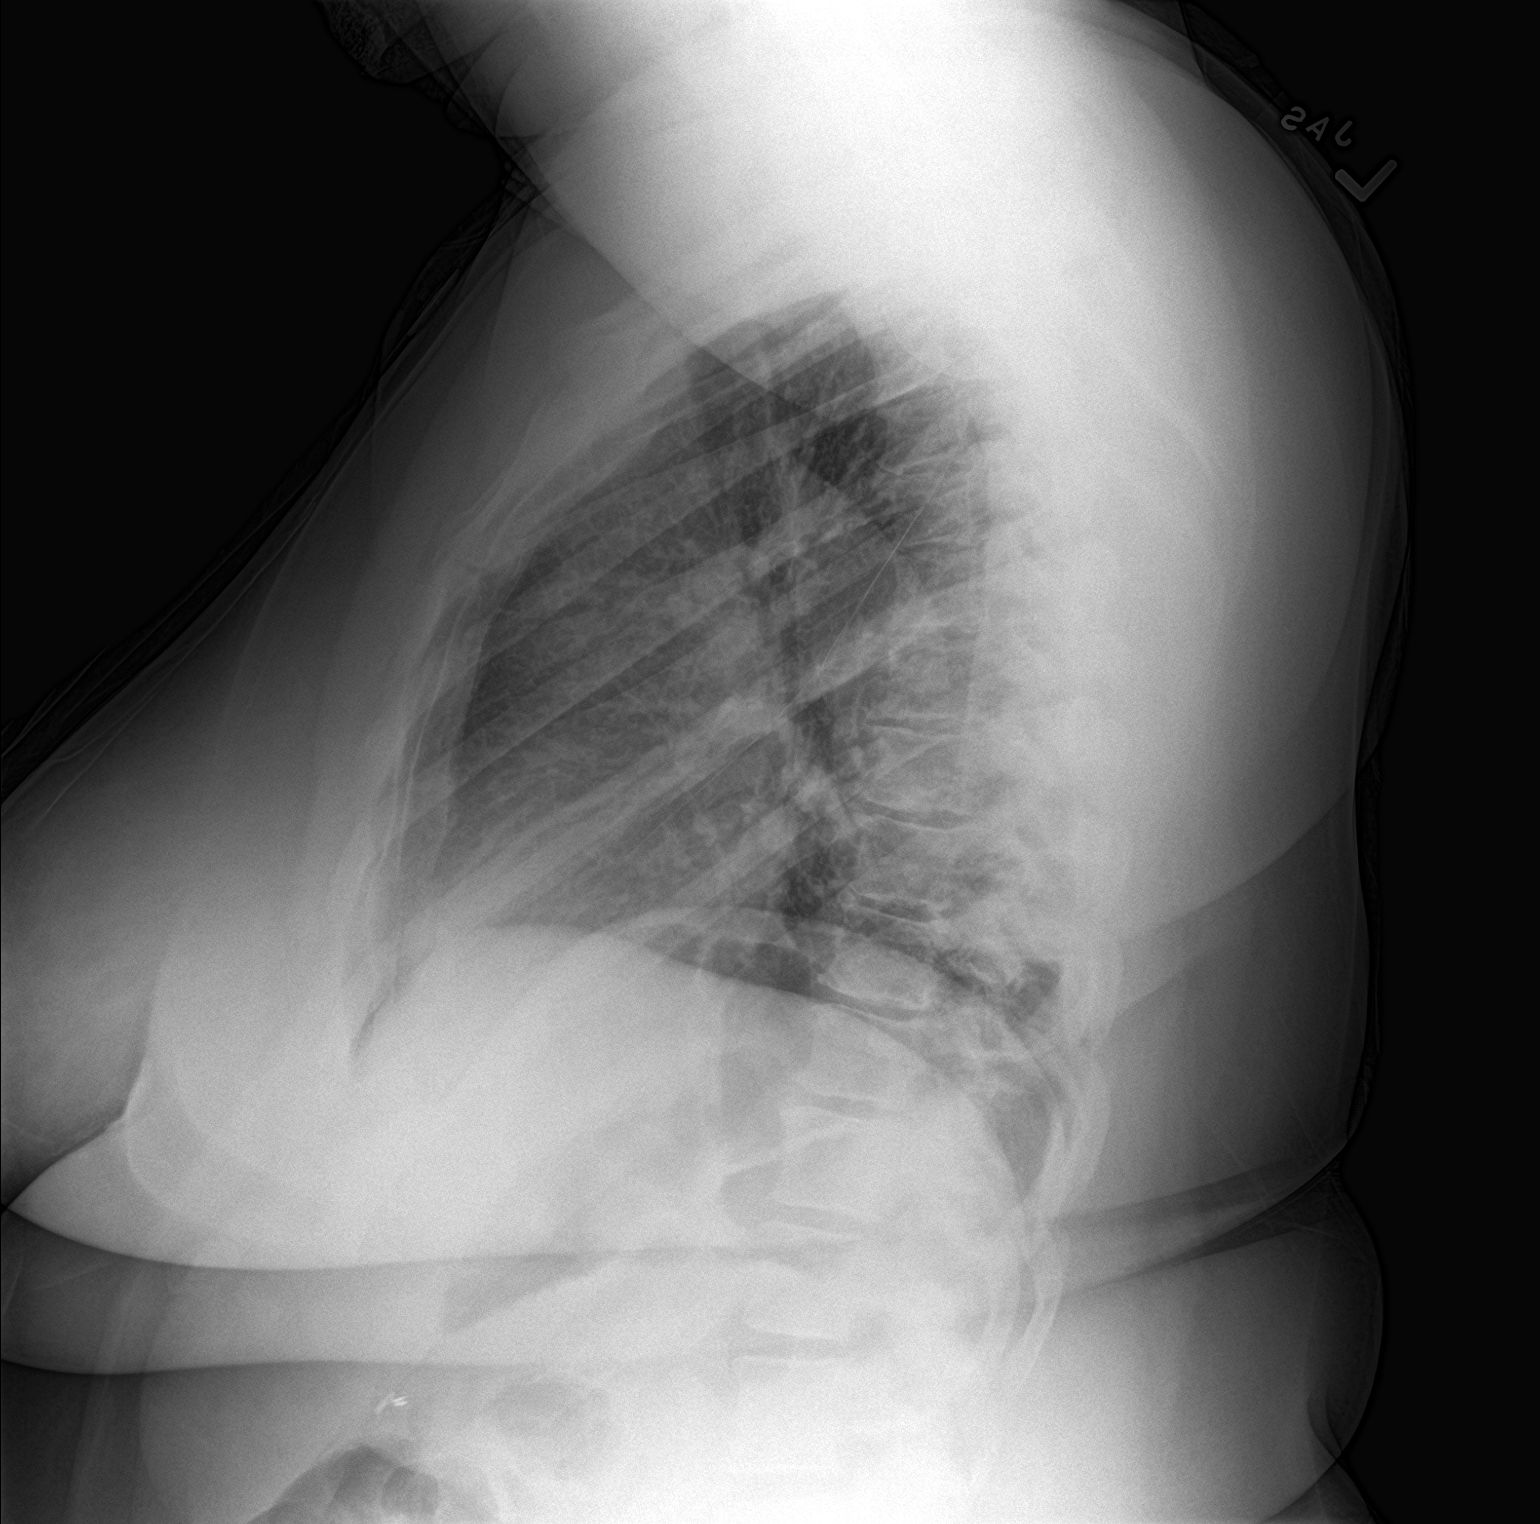

[2 of 2 positions shown; findings below may reference images not displayed]

FINDINGS: No edema or consolidation. Heart size and pulmonary vascularity are
normal. No adenopathy. No bone lesions.
IMPRESSION: No edema or consolidation.

## 2020-08-03 IMAGING — CT CT ANGIO CHEST
2 of 7 series · 16 of 46 positions shown · IV contrast (APPLIED)
Comparison: Chest radiograph-06/01/2018

CLINICAL DATA: Elevated D-dimer. Shortness of breath with exertion.
Evaluate for pulmonary embolism.

EXAM:
CT ANGIOGRAPHY CHEST WITH CONTRAST
TECHNIQUE: Multidetector CT imaging of the chest was performed using the
standard protocol during bolus administration of intravenous
contrast. Multiplanar CT image reconstructions and MIPs were
obtained to evaluate the vascular anatomy.
CONTRAST:  80mL B00ZHG-[id number] IOPAMIDOL (B00ZHG-JM0) INJECTION 76%

[Series 7: thins · axial · 0.68mm/px · z∈[+1227,+1471]mm · 13 of 393 slices shown]
[im 22/393  lung]
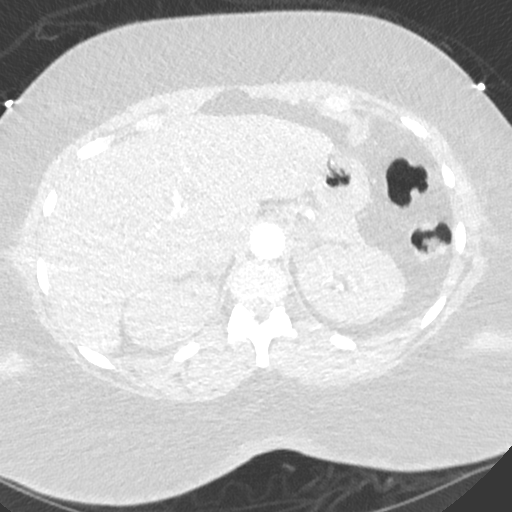
[im 44/393  soft-tissue]
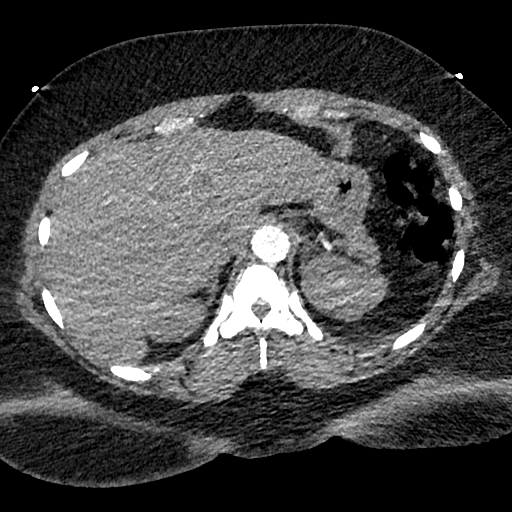
[im 88/393  lung]
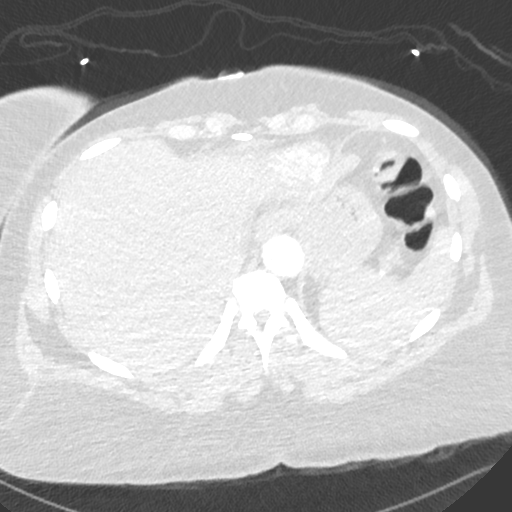
[im 109/393  soft-tissue]
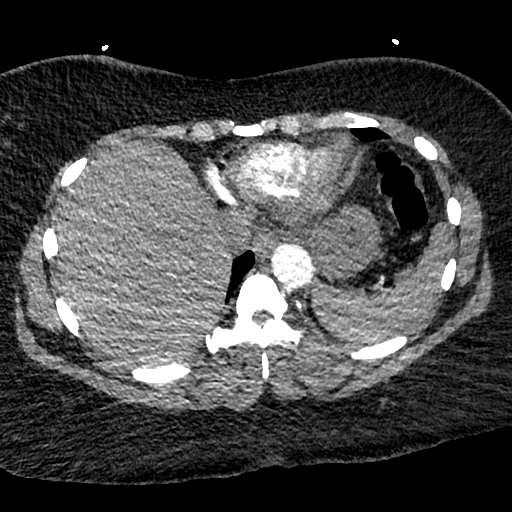
[im 131/393  lung]
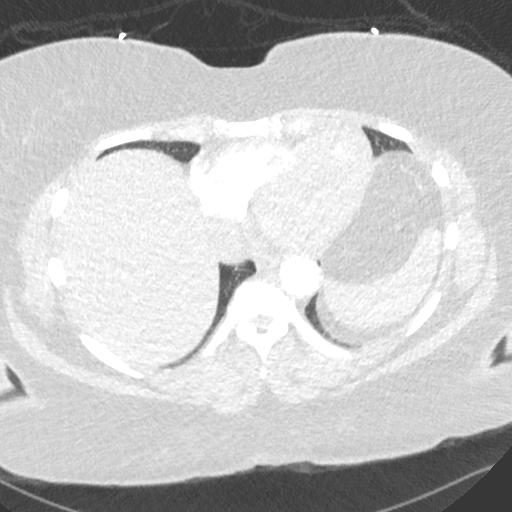
[im 175/393  soft-tissue]
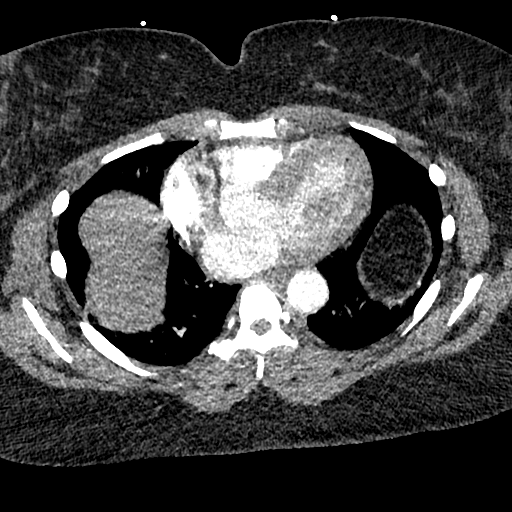
[im 197/393  lung]
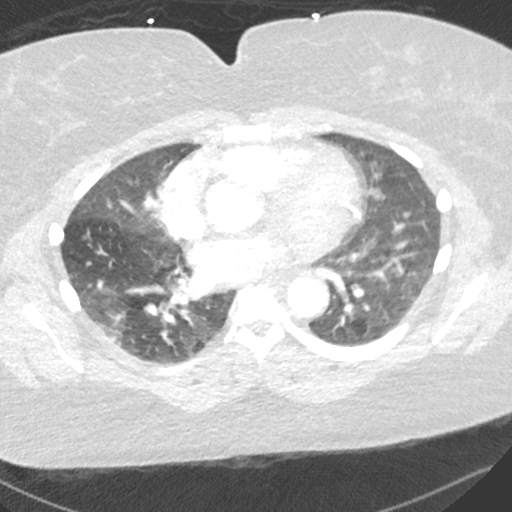
[im 218/393  soft-tissue]
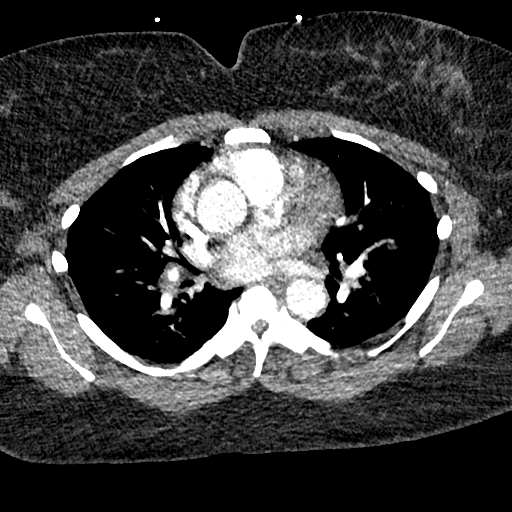
[im 262/393  lung]
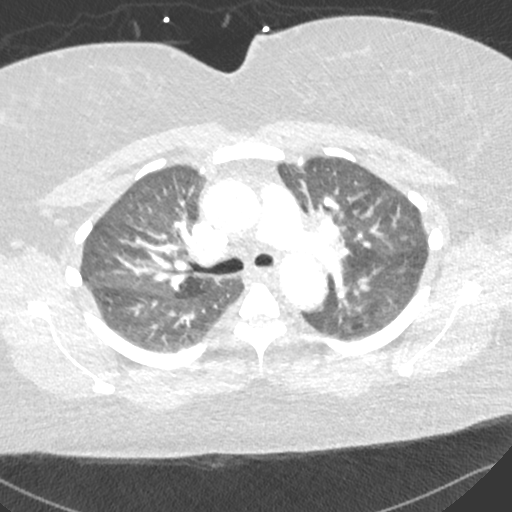
[im 284/393  soft-tissue]
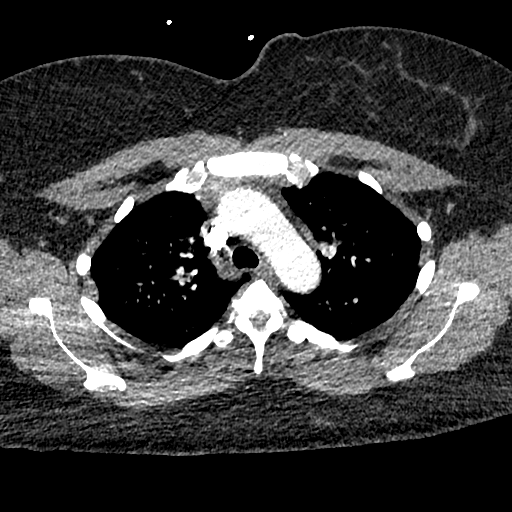
[im 305/393  lung]
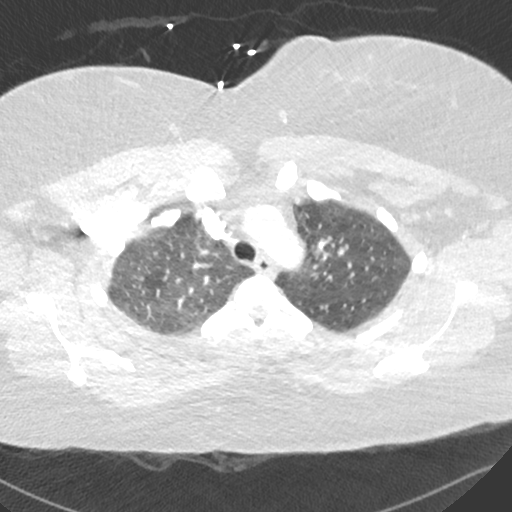
[im 349/393  soft-tissue]
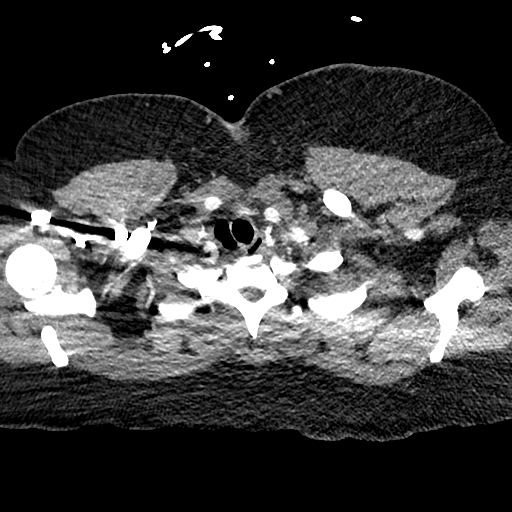
[im 371/393  lung]
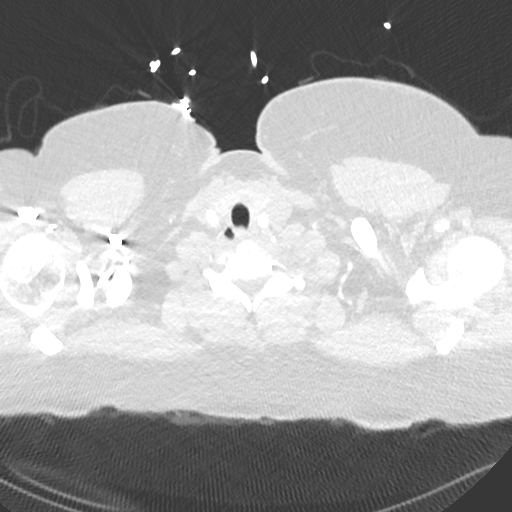

[Series 8: cor · coronal · 0.55mm/px · 3 of 99 slices shown]
[im 25/99  soft-tissue]
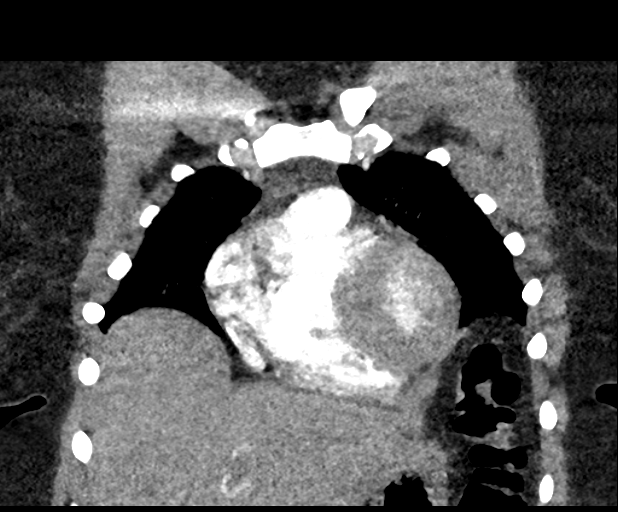
[im 50/99  soft-tissue]
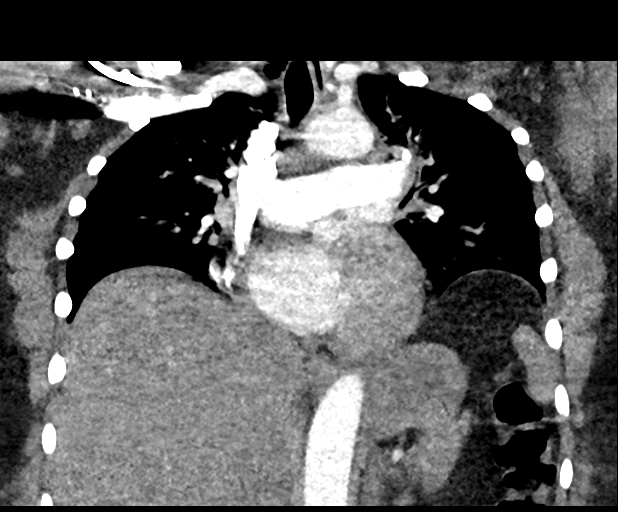
[im 74/99  soft-tissue]
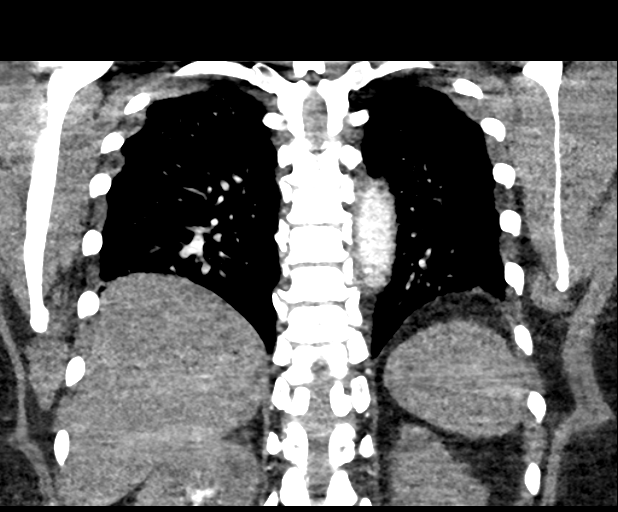

[16 of 46 positions shown; findings below may reference images not displayed]

FINDINGS: Examination is degraded due to patient body habitus and associated
quantum mottle artifact. Additionally, the examination is further
degraded secondary to patient respiratory artifact.

Vascular Findings:

There is adequate opacification of the pulmonary arterial system
with the main pulmonary artery measuring 354 Hounsfield units. There
are no discrete filling defects within the pulmonary arterial tree
to the level of the subsegmental pulmonary arteries suggest
pulmonary embolism. Evaluation of the distal subsegmental pulmonary
arteries is degraded secondary to a combination quantum mottle
artifact due to patient body habitus as well as patient respiratory
artifact.. Borderline enlarged caliber of the main pulmonary artery
measuring 34 mm in diameter (image 55, series 5).

Borderline cardiomegaly. Post coronary stent placement. No
pericardial effusion. No evidence of thoracic aortic aneurysm or
dissection on this nongated examination.

Bovine configuration of the aortic arch. Additionally, the left
vertebral artery appears to arise directly from the aortic arch.
Branch vessels of the aortic arch appear patent throughout their
imaged course.

Review of the MIP images confirms the above findings.

----------------------------------------------------------------------------------

Nonvascular Findings:

Mediastinum/Lymph Nodes: Bilateral axillary lymph nodes are numerous
and mildly prominent though not enlarged by size criteria with index
left axillary lymph node measures 0.7 cm in greatest short axis
diameter (image 35, series 5) and index right axillary lymph node
measuring 0.6 cm (image 34, series 5), favored to be reactive in
etiology due to patient body habitus.. No bulky mediastinal, hilar
or axillary lymphadenopathy.

Lungs/Pleura: Ill-defined geographic areas ground-glass favored to
represent areas air trapping likely associated with patient body
habitus and supine positioning. No discrete focal airspace
opacities. No air bronchograms. No pleural effusion or pneumothorax.
The central pulmonary airways appear patent without discrete area of
bronchial wall thickening or occlusion.

No discrete pulmonary nodules given marked limitation of the
examination.

Upper abdomen: Early arterial phase evaluation of the upper abdomen
suggests mild nodularity hepatic contour as could be seen in the
setting of early cirrhotic change. Post cholecystectomy.

Musculoskeletal: No acute or aggressive osseous abnormalities. DDD
of C6-C7 with disc space height loss, endplate irregularity and
sclerosis.

Regional soft tissues appear normal. Normal appearance of the imaged
portions of the thyroid gland.
IMPRESSION: 1. No acute cardiopulmonary disease on this body habitus and motion
degraded examination. Specifically, no evidence of pulmonary
embolism to the level the bilateral subsegmental pulmonary arteries.
2. Borderline cardiomegaly with enlargement of the caliber of the
main pulmonary artery as could be seen in the setting of pulmonary
arterial hypertension. Further evaluation cardiac echo could be
performed as clinically indicated.
3. Post coronary stent placement.
4. Mild nodularity hepatic contour as could be seen in the setting
of early cirrhotic change. Correlation with LFTs be performed as
indicated.
5. Post cholecystectomy.

## 2021-05-23 ENCOUNTER — Emergency Department (HOSPITAL_COMMUNITY)
Admission: EM | Admit: 2021-05-23 | Discharge: 2021-05-23 | Disposition: A | Discharge status: Home / Self Care | Payer: Medicaid Other | Attending: Emergency Medicine | Admitting: Emergency Medicine

## 2021-05-23 ENCOUNTER — Encounter (HOSPITAL_COMMUNITY): Payer: Self-pay

## 2021-05-23 ENCOUNTER — Other Ambulatory Visit: Payer: Self-pay

## 2021-05-23 DIAGNOSIS — M79602 Pain in left arm: Secondary | ICD-10-CM | POA: Diagnosis present

## 2021-05-23 DIAGNOSIS — Z5321 Procedure and treatment not carried out due to patient leaving prior to being seen by health care provider: Secondary | ICD-10-CM | POA: Diagnosis not present

## 2021-05-23 DIAGNOSIS — R202 Paresthesia of skin: Secondary | ICD-10-CM | POA: Insufficient documentation

## 2021-05-23 NOTE — ED Notes (Signed)
RN about to get her blood work but pt stated her blood was collected few hours ago. RN stated we still need to collect it but pt refused and wants to leave.

## 2021-05-23 NOTE — ED Provider Notes (Signed)
Emergency Medicine Provider Triage Evaluation Note  Julia Ingram , a 45 y.o. female  was evaluated in triage.  Pt complains of left lateral arm pain that began yesterday after receiving pneumonia shot.  She is also complaining of numbness to her fingers and associated shoulder swelling.  Denies any fever or chills.  Review of Systems  Positive:  Negative: See above   Physical Exam  BP (!) 184/103 (BP Location: Right Arm)   Pulse 86   Temp 98.6 F (37 C) (Oral)   Resp 18   Ht 5\' 10"  (1.778 m)   Wt (!) 142.9 kg   SpO2 98%   BMI 45.20 kg/m  Gen:   Awake, no distress   Resp:  Normal effort  MSK:   Moves extremities without difficulty  Other:  Swelling, tenderness, and warmth to palpation over the left lateral deltoid.  Medical Decision Making  Medically screening exam initiated at 4:14 PM.  Appropriate orders placed.  Brigitt Mcclish was informed that the remainder of the evaluation will be completed by another provider, this initial triage assessment does not replace that evaluation, and the importance of remaining in the ED until their evaluation is complete.     Esperanza Richters Deshler, PA-C 05/23/21 1614    05/25/21, MD 05/23/21 [phone]

## 2021-05-23 NOTE — ED Notes (Signed)
Asked pt to get undressed for eval. Pt stated " I ain't taking shit off." Staff made aware.

## 2021-05-23 NOTE — ED Triage Notes (Signed)
Patient reports that she received a pneumonia shot in her left deltoid and is now having numbness to her left fingers and swelling to the left shoulder area. Patient would not pull her jacket down for writer to see the area.

## 2021-11-15 ENCOUNTER — Other Ambulatory Visit: Payer: Self-pay

## 2021-11-15 ENCOUNTER — Encounter (HOSPITAL_COMMUNITY): Payer: Self-pay | Admitting: Emergency Medicine

## 2021-11-15 ENCOUNTER — Emergency Department (HOSPITAL_COMMUNITY): Payer: Medicaid - Out of State

## 2021-11-15 ENCOUNTER — Inpatient Hospital Stay (HOSPITAL_COMMUNITY)
Admission: EM | Admit: 2021-11-15 | Discharge: 2021-11-17 | DRG: 202 | Disposition: A | Discharge status: Home / Self Care | Payer: Medicaid - Out of State | Attending: Student | Admitting: Student

## 2021-11-15 DIAGNOSIS — J4541 Moderate persistent asthma with (acute) exacerbation: Secondary | ICD-10-CM | POA: Diagnosis not present

## 2021-11-15 DIAGNOSIS — Z825 Family history of asthma and other chronic lower respiratory diseases: Secondary | ICD-10-CM

## 2021-11-15 DIAGNOSIS — Z28310 Unvaccinated for covid-19: Secondary | ICD-10-CM

## 2021-11-15 DIAGNOSIS — Z20822 Contact with and (suspected) exposure to covid-19: Secondary | ICD-10-CM | POA: Diagnosis present

## 2021-11-15 DIAGNOSIS — E876 Hypokalemia: Secondary | ICD-10-CM | POA: Diagnosis present

## 2021-11-15 DIAGNOSIS — R739 Hyperglycemia, unspecified: Secondary | ICD-10-CM | POA: Diagnosis present

## 2021-11-15 DIAGNOSIS — J45901 Unspecified asthma with (acute) exacerbation: Principal | ICD-10-CM | POA: Diagnosis present

## 2021-11-15 DIAGNOSIS — I2510 Atherosclerotic heart disease of native coronary artery without angina pectoris: Secondary | ICD-10-CM | POA: Diagnosis present

## 2021-11-15 DIAGNOSIS — J3800 Paralysis of vocal cords and larynx, unspecified: Secondary | ICD-10-CM

## 2021-11-15 DIAGNOSIS — I160 Hypertensive urgency: Secondary | ICD-10-CM | POA: Diagnosis not present

## 2021-11-15 DIAGNOSIS — I10 Essential (primary) hypertension: Secondary | ICD-10-CM | POA: Diagnosis present

## 2021-11-15 DIAGNOSIS — Z6841 Body Mass Index (BMI) 40.0 and over, adult: Secondary | ICD-10-CM

## 2021-11-15 DIAGNOSIS — F17210 Nicotine dependence, cigarettes, uncomplicated: Secondary | ICD-10-CM | POA: Diagnosis present

## 2021-11-15 DIAGNOSIS — B348 Other viral infections of unspecified site: Secondary | ICD-10-CM

## 2021-11-15 DIAGNOSIS — B9789 Other viral agents as the cause of diseases classified elsewhere: Secondary | ICD-10-CM | POA: Diagnosis present

## 2021-11-15 DIAGNOSIS — Z79899 Other long term (current) drug therapy: Secondary | ICD-10-CM

## 2021-11-15 DIAGNOSIS — R0603 Acute respiratory distress: Secondary | ICD-10-CM | POA: Diagnosis present

## 2021-11-15 DIAGNOSIS — J069 Acute upper respiratory infection, unspecified: Secondary | ICD-10-CM | POA: Diagnosis present

## 2021-11-15 DIAGNOSIS — R0602 Shortness of breath: Principal | ICD-10-CM

## 2021-11-15 DIAGNOSIS — I252 Old myocardial infarction: Secondary | ICD-10-CM

## 2021-11-15 HISTORY — DX: Unspecified asthma, uncomplicated: J45.909

## 2021-11-15 LAB — BASIC METABOLIC PANEL
Anion gap: 7 (ref 5–15)
BUN: 8 mg/dL (ref 6–20)
CO2: 29 mmol/L (ref 22–32)
Calcium: 8.6 mg/dL — ABNORMAL LOW (ref 8.9–10.3)
Chloride: 104 mmol/L (ref 98–111)
Creatinine, Ser: 0.55 mg/dL (ref 0.44–1.00)
GFR, Estimated: >60 mL/min (ref >60)
Glucose, Bld: 129 mg/dL — ABNORMAL HIGH (ref 70–99)
Potassium: 3.2 mmol/L — ABNORMAL LOW (ref 3.5–5.1)
Sodium: 140 mmol/L (ref 135–145)

## 2021-11-15 LAB — CBC WITH DIFFERENTIAL/PLATELET
Abs Immature Granulocytes: 0.03 10*3/uL (ref 0.00–0.07)
Basophils Absolute: 0 10*3/uL (ref 0.0–0.1)
Basophils Relative: 0 %
Eosinophils Absolute: 0.1 10*3/uL (ref 0.0–0.5)
Eosinophils Relative: 1 %
HCT: 39 % (ref 36.0–46.0)
Hemoglobin: 12.7 g/dL (ref 12.0–15.0)
Immature Granulocytes: 0 %
Lymphocytes Relative: 21 %
Lymphs Abs: 2.1 10*3/uL (ref 0.7–4.0)
MCH: 29.9 pg (ref 26.0–34.0)
MCHC: 32.6 g/dL (ref 30.0–36.0)
MCV: 91.8 fL (ref 80.0–100.0)
Monocytes Absolute: 1 10*3/uL (ref 0.1–1.0)
Monocytes Relative: 10 %
Neutro Abs: 6.5 10*3/uL (ref 1.7–7.7)
Neutrophils Relative %: 68 %
Platelets: 383 10*3/uL (ref 150–400)
RBC: 4.25 MIL/uL (ref 3.87–5.11)
RDW: 13.8 % (ref 11.5–15.5)
WBC: 9.8 10*3/uL (ref 4.0–10.5)
nRBC: 0 % (ref 0.0–0.2)

## 2021-11-15 LAB — RESP PANEL BY RT-PCR (FLU A&B, COVID) ARPGX2
Influenza A by PCR: NEGATIVE
Influenza B by PCR: NEGATIVE
SARS Coronavirus 2 by RT PCR: NEGATIVE

## 2021-11-15 LAB — MAGNESIUM: Magnesium: 2.2 mg/dL (ref 1.7–2.4)

## 2021-11-15 MED ORDER — ARFORMOTEROL TARTRATE 15 MCG/2ML IN NEBU
15.0000 ug | INHALATION_SOLUTION | Freq: Two times a day (BID) | RESPIRATORY_TRACT | Status: DC
  Administered 2021-11-15 – 2021-11-17 (×4): 15 ug via RESPIRATORY_TRACT
  Filled 2021-11-15 (×5): qty 2

## 2021-11-15 MED ORDER — ENOXAPARIN SODIUM 40 MG/0.4ML IJ SOSY: 40.0000 mg | PREFILLED_SYRINGE | INTRAMUSCULAR | Status: DC

## 2021-11-15 MED ORDER — POLYETHYLENE GLYCOL 3350 17 G PO PACK
17.0000 g | PACK | Freq: Two times a day (BID) | ORAL | Status: DC | PRN
Start: 2021-11-15 — End: 2021-11-17

## 2021-11-15 MED ORDER — REVEFENACIN 175 MCG/3ML IN SOLN
175.0000 ug | Freq: Every day | RESPIRATORY_TRACT | Status: DC
  Administered 2021-11-17: 175 ug via RESPIRATORY_TRACT
  Filled 2021-11-15 (×2): qty 3

## 2021-11-15 MED ORDER — IPRATROPIUM-ALBUTEROL 0.5-2.5 (3) MG/3ML IN SOLN
3.0000 mL | Freq: Once | RESPIRATORY_TRACT | Status: AC
  Administered 2021-11-15: 3 mL via RESPIRATORY_TRACT
  Filled 2021-11-15: qty 3

## 2021-11-15 MED ORDER — ACETAMINOPHEN 650 MG RE SUPP: 650.0000 mg | Freq: Four times a day (QID) | RECTAL | Status: DC | PRN

## 2021-11-15 MED ORDER — ONDANSETRON HCL 4 MG PO TABS
4.0000 mg | ORAL_TABLET | Freq: Four times a day (QID) | ORAL | Status: DC | PRN
Start: 2021-11-15 — End: 2021-11-17

## 2021-11-15 MED ORDER — METHYLPREDNISOLONE SODIUM SUCC 125 MG IJ SOLR
60.0000 mg | Freq: Two times a day (BID) | INTRAMUSCULAR | Status: DC
  Administered 2021-11-15 – 2021-11-17 (×4): 60 mg via INTRAVENOUS
  Filled 2021-11-15 (×5): qty 2

## 2021-11-15 MED ORDER — ACETAMINOPHEN 325 MG PO TABS: 650.0000 mg | ORAL_TABLET | Freq: Four times a day (QID) | ORAL | Status: DC | PRN

## 2021-11-15 MED ORDER — POTASSIUM CHLORIDE CRYS ER 20 MEQ PO TBCR
40.0000 meq | EXTENDED_RELEASE_TABLET | ORAL | Status: AC
  Administered 2021-11-15 (×2): 40 meq via ORAL
  Filled 2021-11-15 (×2): qty 2

## 2021-11-15 MED ORDER — LISINOPRIL 10 MG PO TABS
5.0000 mg | ORAL_TABLET | Freq: Once | ORAL | Status: AC
  Administered 2021-11-15: 5 mg via ORAL
  Filled 2021-11-15: qty 1

## 2021-11-15 MED ORDER — METHYLPREDNISOLONE SODIUM SUCC 125 MG IJ SOLR: 125.0000 mg | Freq: Once | INTRAMUSCULAR | Status: DC

## 2021-11-15 MED ORDER — HYDRALAZINE HCL 25 MG PO TABS: 25.0000 mg | ORAL_TABLET | Freq: Four times a day (QID) | ORAL | Status: DC | PRN

## 2021-11-15 MED ORDER — PANTOPRAZOLE SODIUM 40 MG PO TBEC
40.0000 mg | DELAYED_RELEASE_TABLET | Freq: Every day | ORAL | Status: DC
  Administered 2021-11-15 – 2021-11-17 (×3): 40 mg via ORAL
  Filled 2021-11-15 (×3): qty 1

## 2021-11-15 MED ORDER — DM-GUAIFENESIN ER 30-600 MG PO TB12
1.0000 | ORAL_TABLET | Freq: Two times a day (BID) | ORAL | Status: DC
  Administered 2021-11-15 – 2021-11-17 (×4): 1 via ORAL
  Filled 2021-11-15 (×4): qty 1

## 2021-11-15 MED ORDER — PREDNISONE 20 MG PO TABS
80.0000 mg | ORAL_TABLET | Freq: Once | ORAL | Status: AC
  Administered 2021-11-15: 80 mg via ORAL
  Filled 2021-11-15: qty 4

## 2021-11-15 MED ORDER — METHYLPREDNISOLONE SODIUM SUCC 125 MG IJ SOLR: 60.0000 mg | Freq: Two times a day (BID) | INTRAMUSCULAR | Status: DC

## 2021-11-15 MED ORDER — ONDANSETRON HCL 4 MG/2ML IJ SOLN
4.0000 mg | Freq: Four times a day (QID) | INTRAMUSCULAR | Status: DC | PRN
Start: 2021-11-15 — End: 2021-11-17

## 2021-11-15 MED ORDER — HYDRALAZINE HCL 20 MG/ML IJ SOLN
10.0000 mg | Freq: Once | INTRAMUSCULAR | Status: AC
  Administered 2021-11-15: 10 mg via INTRAVENOUS
  Filled 2021-11-15: qty 1

## 2021-11-15 MED ORDER — LOSARTAN POTASSIUM 50 MG PO TABS
50.0000 mg | ORAL_TABLET | Freq: Every day | ORAL | Status: DC
Start: 2021-11-15 — End: 2021-11-17
  Administered 2021-11-15 – 2021-11-17 (×3): 50 mg via ORAL
  Filled 2021-11-15 (×2): qty 1
  Filled 2021-11-15: qty 2

## 2021-11-15 MED ORDER — BUDESONIDE 0.25 MG/2ML IN SUSP
0.2500 mg | Freq: Two times a day (BID) | RESPIRATORY_TRACT | Status: DC
  Administered 2021-11-15 – 2021-11-17 (×4): 0.25 mg via RESPIRATORY_TRACT
  Filled 2021-11-15 (×4): qty 2

## 2021-11-15 MED ORDER — SENNOSIDES-DOCUSATE SODIUM 8.6-50 MG PO TABS: 1.0000 | ORAL_TABLET | Freq: Two times a day (BID) | ORAL | Status: DC | PRN

## 2021-11-15 MED ORDER — AMLODIPINE BESYLATE 5 MG PO TABS
10.0000 mg | ORAL_TABLET | Freq: Once | ORAL | Status: AC
  Administered 2021-11-15: 10 mg via ORAL
  Filled 2021-11-15: qty 2

## 2021-11-15 MED ORDER — IPRATROPIUM-ALBUTEROL 0.5-2.5 (3) MG/3ML IN SOLN
3.0000 mL | RESPIRATORY_TRACT | Status: DC | PRN
  Administered 2021-11-16 (×4): 3 mL via RESPIRATORY_TRACT
  Filled 2021-11-15 (×5): qty 3

## 2021-11-15 NOTE — ED Triage Notes (Signed)
Per pt, states she started with congestion yesterday-states increased SOB and dyspnea upon exertion and at rest-states rescue inhaler not working-place on 2L [place] ?

## 2021-11-15 NOTE — Assessment & Plan Note (Deleted)
Resolved

## 2021-11-15 NOTE — ED Notes (Signed)
ED TO INPATIENT HANDOFF REPORT ? ?ED Nurse Name and Phone #: Fredric Marebailey ? ?S ?Name/Age/Gender ?Julia Ingram ?46 y.o. ?female ?Room/Bed: ZO10/RU04WA24/WA24 ? ?Code Status ?  Code Status: Full Code ? ?Home/SNF/Other ?Home ?Patient oriented to: self, place, time, and situation ?Is this baseline? Yes  ? ?Triage Complete: Triage complete  ?Chief Complaint ?Asthma exacerbation [J45.901] ? ?Triage Note ?Per pt, states she started with congestion yesterday-states increased SOB and dyspnea upon exertion and at rest-states rescue inhaler not working-place on 2L [place]  ? ?Allergies ?Allergies  ?Allergen Reactions  ? Demerol  [Meperidine Hcl] Hives  ? Magnesium Sulfate Anaphylaxis  ?  Per patient on 05/16/15, said her heart stopped when she received this medication.  ? Meperidine Hives  ? Albuterol Nausea And Vomiting  ? Gadolinium Derivatives Hives and Nausea And Vomiting  ? Other Other (See Comments)  ?  Macadamia nuts - SOB  ? Pirbuterol Other (See Comments)  ?  Headache ?  ? ? ?Level of Care/Admitting Diagnosis ?ED Disposition   ? ? ED Disposition  ?Admit  ? Condition  ?--  ? Comment  ?[facility] [[id number]] ? Level of Care: Progressive [102] ? Admit to Progressive based on following criteria: CARDIOVASCULAR & THORACIC of moderate stability with acute coronary syndrome symptoms/low risk myocardial infarction/hypertensive urgency/arrhythmias/heart failure potentially compromising stability and stable post cardiovascular intervention patients. ? Admit to Progressive based on following criteria: RESPIRATORY PROBLEMS hypoxemic/hypercapnic respiratory failure that is responsive to NIPPV (BiPAP) or High Flow Nasal Cannula (6-80 lpm). Frequent assessment/intervention, no > Q2 hrs < Q4 hrs, to maintain oxygenation and pulmonary hygiene. ? May place patient in [facility] at [facility] or [facility] if equivalent level of care is available:: No ? Covid Evaluation: Symptomatic Person Under Investigation (PUI) or  recent exposure (last 10 days) *Testing Required* ? Diagnosis: Asthma exacerbation [540981][[id number]] ? Admitting Physician: Almon HerculesGONFA, TAYE T [[id number]] ? Attending Physician: Almon HerculesGONFA, TAYE T [[id number]] ?  ?  ? ?  ? ? ?B ?Medical/Surgery History ?Past Medical History:  ?Diagnosis Date  ? Asthma   ? CAD (coronary artery disease) 06/06/2019  ? Hypertension   ? MI (myocardial infarction) (HCC)   ? Pyloric stenosis   ? Vocal cord paralysis 06/06/2019  ? ?Past Surgical History:  ?Procedure Laterality Date  ? APPENDECTOMY    ? cervix    ? [place]  ? CHOLECYSTECTOMY    ? MEDIAL PARTIAL KNEE REPLACEMENT    ?  ? ?A ?IV Location/Drains/Wounds ?Patient Lines/Drains/Airways Status   ? ? Active Line/Drains/Airways   ? ? Name Placement date Placement time Site Days  ? Peripheral IV 11/15/21 20 G Anterior;Left;Proximal Forearm 11/15/21  --  Forearm  less than 1  ? Wound / Incision (Open or Dehisced) 06/01/18 Other (Comment) 06/01/18  [id number]  --  1263  ? ?  ?  ? ?  ? ? ?Intake/Output Last 24 hours ?No intake or output data in the 24 hours ending 11/15/21 1950 ? ?Labs/Imaging ?Results for orders placed or performed during the hospital encounter of 11/15/21 (from the past 48 hour(s))  ?Basic metabolic panel     Status: Abnormal  ? Collection Time: 11/15/21  3:23 PM  ?Result Value Ref Range  ? Sodium 140 135 - 145 mmol/L  ? Potassium 3.2 (L) 3.5 - 5.1 mmol/L  ? Chloride 104 98 - 111 mmol/L  ? CO2 29 22 - 32 mmol/L  ? Glucose, Bld 129 (H) 70 - 99 mg/dL  ?  Comment: Glucose reference  range applies only to samples taken after fasting for at least 8 hours.  ? BUN 8 6 - 20 mg/dL  ? Creatinine, Ser 0.55 0.44 - 1.00 mg/dL  ? Calcium 8.6 (L) 8.9 - 10.3 mg/dL  ? GFR, Estimated >60 >60 mL/min  ?  Comment: (NOTE) ?Calculated using the CKD-EPI Creatinine Equation (2021) ?  ? Anion gap 7 5 - 15  ?  Comment: Performed at [facility], [address], [place], [place] [address]  ?CBC with Differential     Status: None  ? Collection Time: 11/15/21   3:23 PM  ?Result Value Ref Range  ? WBC 9.8 4.0 - 10.5 K/uL  ? RBC 4.25 3.87 - 5.11 MIL/uL  ? Hemoglobin 12.7 12.0 - 15.0 g/dL  ? HCT 39.0 36.0 - 46.0 %  ? MCV 91.8 80.0 - 100.0 fL  ? MCH 29.9 26.0 - 34.0 pg  ? MCHC 32.6 30.0 - 36.0 g/dL  ? RDW 13.8 11.5 - 15.5 %  ? Platelets 383 150 - 400 K/uL  ? nRBC 0.0 0.0 - 0.2 %  ? Neutrophils Relative % 68 %  ? Neutro Abs 6.5 1.7 - 7.7 K/uL  ? Lymphocytes Relative 21 %  ? Lymphs Abs 2.1 0.7 - 4.0 K/uL  ? Monocytes Relative 10 %  ? Monocytes Absolute 1.0 0.1 - 1.0 K/uL  ? Eosinophils Relative 1 %  ? Eosinophils Absolute 0.1 0.0 - 0.5 K/uL  ? Basophils Relative 0 %  ? Basophils Absolute 0.0 0.0 - 0.1 K/uL  ? Immature Granulocytes 0 %  ? Abs Immature Granulocytes 0.03 0.00 - 0.07 K/uL  ?  Comment: Performed at [facility], [address], [place], [place] [address]  ?Magnesium     Status: None  ? Collection Time: 11/15/21  3:23 PM  ?Result Value Ref Range  ? Magnesium 2.2 1.7 - 2.4 mg/dL  ?  Comment: Performed at [facility], [address], [place], [place] [address]  ?Resp Panel by RT-PCR (Flu A&B, Covid) Nasopharyngeal Swab     Status: None  ? Collection Time: 11/15/21  3:30 PM  ? Specimen: Nasopharyngeal Swab; Nasopharyngeal(NP) swabs in vial transport medium  ?Result Value Ref Range  ? SARS Coronavirus 2 by RT PCR NEGATIVE NEGATIVE  ?  Comment: (NOTE) ?SARS-CoV-2 target nucleic acids are NOT DETECTED. ? ?The SARS-CoV-2 RNA is generally detectable in upper respiratory ?specimens during the acute phase of infection. The lowest ?concentration of SARS-CoV-2 viral copies this assay can detect is ?138 copies/mL. A negative result does not preclude SARS-Cov-2 ?infection and should not be used as the sole basis for treatment or ?other patient management decisions. A negative result may occur with  ?improper specimen collection/handling, submission of specimen other ?than nasopharyngeal swab, presence of viral mutation(s) within the ?areas  targeted by this assay, and inadequate number of viral ?copies(<138 copies/mL). A negative result must be combined with ?clinical observations, patient history, and epidemiological ?information. The expected result is Negative. ? ?Fact Sheet for Patients:  ?BloggerCourse.com ? ?Fact Sheet for Healthcare Providers:  ?SeriousBroker.it ? ?This test is no t yet approved or cleared by the [place] FDA and  ?has been authorized for detection and/or diagnosis of SARS-CoV-2 by ?FDA under an Emergency Use Authorization (EUA). This EUA will remain  ?in effect (meaning this test can be used) for the duration of the ?COVID-19 declaration under Section 564(b)(1) of the Act, 21 ?U.S.C.section 360bbb-3(b)(1), unless the authorization is terminated  ?or  revoked sooner.  ? ? ?  ? Influenza A by PCR NEGATIVE NEGATIVE  ? Influenza B by PCR NEGATIVE NEGATIVE  ?  Comment: (NOTE) ?The Xpert Xpress SARS-CoV-2/FLU/RSV plus assay is intended as an aid ?in the diagnosis of influenza from Nasopharyngeal swab specimens and ?should not be used as a sole basis for treatment. Nasal washings and ?aspirates are unacceptable for Xpert Xpress SARS-CoV-2/FLU/RSV ?testing. ? ?Fact Sheet for Patients: ?BloggerCourse.com ? ?Fact Sheet for Healthcare Providers: ?SeriousBroker.it ? ?This test is not yet approved or cleared by the [place] FDA and ?has been authorized for detection and/or diagnosis of SARS-CoV-2 by ?FDA under an Emergency Use Authorization (EUA). This EUA will remain ?in effect (meaning this test can be used) for the duration of the ?COVID-19 declaration under Section 564(b)(1) of the Act, 21 U.S.C. ?section 360bbb-3(b)(1), unless the authorization is terminated or ?revoked. ? ?Performed at [facility], [address] ?[place], [place] [address] ?  ? ?DG Chest Port 1 View ? ?Result Date: 11/15/2021 ?CLINICAL DATA:   Shortness of breath and congestion. EXAM: PORTABLE CHEST 1 VIEW COMPARISON:  Chest radiograph 06/06/2019 FINDINGS: The cardiac silhouette is borderline enlarged. Lung volumes are low with mild accentuatio

## 2021-11-15 NOTE — Hospital Course (Addendum)
46 year old F with PMH of asthma, vocal cord paralysis, CAD, morbid obesity, HTN and tobacco use disorder presenting with shortness of breath, nasal congestion, cough and dyspnea on exertion, and admitted for asthma exacerbation and hypertensive urgency.  Full RVP positive for rhinovirus. ? ?Eventually, patient's symptoms improved and she felt well and ready to go home.  She is discharged on p.o. prednisone, home Symbicort and albuterol.  She was encouraged to quit smoking cigarettes.  Transition of care team assisted with PCP and medications. ? ? ?

## 2021-11-15 NOTE — ED Notes (Signed)
Lab to result Resp Panel from previously collected swab (COVID/FluA/B) ?

## 2021-11-15 NOTE — Assessment & Plan Note (Addendum)
BP as high as 220/153 in ED.  Patient not taking antihypertensive meds at home.  Blood pressure improved. ?-Renewed Rx for amlodipine and losartan. ?-She could benefit from sleep study outpatient ?

## 2021-11-15 NOTE — Assessment & Plan Note (Addendum)
Admits to smoking about half a pack a day. ?-Encouraged cessation and provided resources. ?

## 2021-11-15 NOTE — ED Provider Notes (Signed)
[facility]Provider Note ? ? ?CSN: [id number] ?Arrival date & time: 11/15/21  1444 ? ?  ? ?History ? ?Chief Complaint  ?Patient presents with  ? Shortness of Breath  ? ? ?Julia Ingram is a 46 y.o. female. ? ?46 year old female with prior medical history as detailed below presents for evaluation.  Patient with prior history of asthma.  Patient reports increased congestion x2 to 3 days.  Patient reports increased shortness of breath and wheezing since yesterday. ? ?Patient reports that she used a rescue inhaler at home without significant improvement in symptoms. ? ?Patient denies fever.  She denies chest pain. ? ?She reports prior admission for asthma exacerbation approximately 2 years ago. ? ?The history is provided by the patient and medical records.  ?Shortness of Breath ?Severity:  Moderate ?Onset quality:  Gradual ?Duration:  1 day ?Timing:  Constant ?Progression:  Worsening ?Chronicity:  New ?Relieved by:  Nothing ?Worsened by:  Nothing ? ?  ? ?Home Medications ?Prior to Admission medications   ?Medication Sig Start Date End Date Taking? Authorizing Provider  ?albuterol (VENTOLIN HFA) 108 (90 Base) MCG/ACT inhaler Inhale 2 puffs into the lungs every 6 (six) hours as needed for wheezing or shortness of breath. 06/08/19   Rodolph Bong, MD  ?amLODipine (NORVASC) 10 MG tablet Take 1 tablet (10 mg total) by mouth daily. 06/09/19   Rodolph Bong, MD  ?aspirin EC 81 MG tablet Take 81 mg by mouth daily.    [provider]  ?aspirin-acetaminophen-caffeine (EXCEDRIN MIGRAINE) [phone] MG tablet Take 2 tablets by mouth every 6 (six) hours as needed for headache.     [provider]  ?cholecalciferol (VITAMIN D3) 25 MCG (1000 UT) tablet Take by mouth. 01/01/19   [provider]  ?CLARITIN 10 MG tablet Take 10 mg by mouth at bedtime. 06/01/19   [provider]  ?fluticasone (FLONASE) 50 MCG/ACT nasal spray Place 2 sprays into both nostrils  daily. 06/08/19   Rodolph Bong, MD  ?fluticasone furoate-vilanterol (BREO ELLIPTA) 200-25 MCG/INH AEPB Inhale 1 puff into the lungs daily. 06/09/19   Rodolph Bong, MD  ?losartan (COZAAR) 100 MG tablet Take 1 tablet (100 mg total) by mouth daily. 06/09/19   Rodolph Bong, MD  ?metoprolol succinate (TOPROL-XL) 100 MG 24 hr tablet Take 1 tablet (100 mg total) by mouth daily. 06/08/19   Rodolph Bong, MD  ?nicotine (NICODERM CQ - DOSED IN MG/24 HOURS) 14 mg/24hr patch Place 1 patch (14 mg total) onto the skin daily. 06/09/19   Rodolph Bong, MD  ?pantoprazole (PROTONIX) 40 MG tablet Take 1 tablet (40 mg total) by mouth daily at 6 (six) AM. 06/09/19   Rodolph Bong, MD  ?rosuvastatin (CRESTOR) 40 MG tablet Take 40 mg by mouth daily.    [provider]  ?umeclidinium bromide (INCRUSE ELLIPTA) 62.5 MCG/INH AEPB Inhale 1 puff into the lungs daily. 06/09/19   Rodolph Bong, MD  ?   ? ?Allergies    ?Demerol  [meperidine hcl], Magnesium sulfate, Meperidine, Gadolinium derivatives, and Pirbuterol   ? ?Review of Systems   ?Review of Systems  ?Respiratory:  Positive for shortness of breath.   ?All other systems reviewed and are negative. ? ?Physical Exam ?Updated Vital Signs ?BP (!) 162/121   Pulse 95   Temp 98 ?F (36.7 ?C) (Oral)   Resp (!) 27   SpO2 98%  ?Physical Exam ?Vitals and nursing note reviewed.  ?Constitutional:   ?  General: She is not in acute distress. ?   Appearance: Normal appearance. She is well-developed.  ?HENT:  ?   Head: Normocephalic and atraumatic.  ?Eyes:  ?   Conjunctiva/sclera: Conjunctivae normal.  ?   Pupils: Pupils are equal, round, and reactive to light.  ?Cardiovascular:  ?   Rate and Rhythm: Normal rate and regular rhythm.  ?   Heart sounds: Normal heart sounds.  ?Pulmonary:  ?   Effort: Pulmonary effort is normal. No respiratory distress.  ?   Breath sounds: Decreased breath sounds present.  ?   Comments: Patient with diffuse bilateral expiratory wheezes  in all lung fields ?Abdominal:  ?   General: There is no distension.  ?   Palpations: Abdomen is soft.  ?   Tenderness: There is no abdominal tenderness.  ?Musculoskeletal:     ?   General: No deformity. Normal range of motion.  ?   Cervical back: Normal range of motion and neck supple.  ?Skin: ?   General: Skin is warm and dry.  ?Neurological:  ?   General: No focal deficit present.  ?   Mental Status: She is alert and oriented to person, place, and time.  ? ? ?ED Results / Procedures / Treatments   ?Labs ?(all labs ordered are listed, but only abnormal results are displayed) ?Labs Reviewed  ?RESP PANEL BY RT-PCR (FLU A&B, COVID) ARPGX2  ?CBC WITH DIFFERENTIAL/PLATELET  ?BASIC METABOLIC PANEL  ? ? ?EKG ?EKG Interpretation ? ?Date/Time:  Wednesday November 15 2021 14:59:12 EDT ?Ventricular Rate:  102 ?PR Interval:  191 ?QRS Duration: 91 ?QT Interval:  351 ?QTC Calculation: 458 ?R Axis:   -77 ?Text Interpretation: Sinus tachycardia Biatrial enlargement Left anterior fascicular block RSR' in V1 or V2, right VCD or RVH Probable left ventricular hypertrophy Nonspecific T abnormalities, lateral leads Baseline wander in lead(s) I III aVL Confirmed by Kristine Royal [phone]) on 11/15/2021 3:23:09 PM ? ?Radiology ?No results found. ? ?Procedures ?Procedures  ? ? ?Medications Ordered in ED ?Medications  ?ipratropium-albuterol (DUONEB) 0.5-2.5 (3) MG/3ML nebulizer solution 3 mL (3 mLs Nebulization Given 11/15/21 1540)  ?predniSONE (DELTASONE) tablet 80 mg (80 mg Oral Given 11/15/21 1540)  ? ? ?ED Course/ Medical Decision Making/ A&P ?  ?                        ?Medical Decision Making ?Amount and/or Complexity of Data Reviewed ?Labs: ordered. ?Radiology: ordered. ? ?Risk ?Prescription drug management. ? ? ? ?Medical Screen Complete ? ?This patient presented to the ED with complaint of shortness of breath, wheezing. ? ?This complaint involves an extensive number of treatment options. The initial differential diagnosis includes, but  is not limited to, asthma exacerbation. ? ?This presentation is: Acute, Chronic, Self-Limited, Previously Undiagnosed, Uncertain Prognosis, Complicated, Systemic Symptoms, and Threat to Life/Bodily Function ? ?Patient with prior history of asthma presents with complaint of shortness of breath and wheezing.  Patient's describe symptoms are consistent with prior episodes of asthma exacerbation. ? ?Patient is persistently wheezing despite initial therapy. ? ?Patient would likely benefit from admission and observation and continued treatment. ? ?Hospitalist service made aware of case and will evaluate for admission. ? ? ? ?Co morbidities that complicated the patient's evaluation ? ?History of prior asthma exacerbation, hypertension, morbid obesity, vocal cord paralysis, tobacco abuse, CAD ? ? ?Additional history obtained: ? ?External records from outside sources obtained and reviewed including prior ED visits and prior Inpatient records.  ? ? ?Lab  Tests: ? ?I ordered and personally interpreted labs.  The pertinent results include: CBC, BMP, COVID, flu ? ? ?Imaging Studies ordered: ? ?I ordered imaging studies including chest x-ray ?I independently visualized and interpreted obtained imaging which showed NAD ?I agree with the radiologist interpretation. ? ? ?Cardiac Monitoring: ? ?The patient was maintained on a cardiac monitor.  I personally viewed and interpreted the cardiac monitor which showed an underlying rhythm of: NSR ? ? ?Medicines ordered: ? ?I ordered medication including DuoNeb treatment, prednisone for asthma exacerbation ?Reevaluation of the patient after these medicines showed that the patient: improved ? ? ?Problem List / ED Course: ? ?Shortness of breath, asthma exacerbation ? ? ?Reevaluation: ? ?After the interventions noted above, I reevaluated the patient and found that they have: improved ? ?Disposition: ? ?After consideration of the diagnostic results and the patients response to treatment, I feel  that the patent would benefit from admission.  ?` ? ? ? ? ? ? ? ? ?Final Clinical Impression(s) / ED Diagnoses ?Final diagnoses:  ?Shortness of breath  ?Hypertension, unspecified type  ? ? ?Rx / DC Orders ?E

## 2021-11-15 NOTE — H&P (Signed)
?History and Physical  ? ? ?Patient: Julia Ingram [id number] DOB: 1975/11/14 ?DOA: 11/15/2021 ?DOS: the patient was seen and examined on 11/15/2021 ?PCP: Patient, No Pcp Per (Inactive)  ?Patient coming from: Home ? ?Chief Complaint:  ?Chief Complaint  ?Patient presents with  ? Shortness of Breath  ? ?HPI: Julia Ingram  ?46 year old F with PMH of asthma, vocal cord paralysis, CAD, morbid obesity, HTN and tobacco use disorder presenting with shortness of breath, nasal congestion, cough and dyspnea on exertion. ? ?Patient reports progressive shortness of breath, congestion and DOE for 2 days.  She also reports cough but not able to cough up phlegm due to shortness of breath.  She reports runny nose but no sore throat, chest pain, nausea, vomiting or abdominal pain.  She used her inhalers without significant improvement.  She had Sudafed for "sinus congestion" that did help with the headache and congestion but not with the breathing.  She denies fever but admits to chills.  She denies focal weakness, numbness or tingling.  Denies UTI symptoms.  She is unvaccinated against COVID-19.  She had her flu vaccine.  She denies sick contact.  She admits to smoking cigarettes.  Smokes about half a pack a day.  She denies drinking alcohol recreational drug use.  Her blood pressure was elevated in ED.  She says she did not take her blood pressure medication this morning. ? ?In ED, BP went from 158/97-224/153.  Slightly tachycardic to 107.  RR ranges from 18-27.  She was saturating from 93 to 97% on 2 L.  CXR with low lung volumes.  K3.2.  Otherwise, BMP and CBC without significant finding.  EKG with sinus tachycardia and PRWP on my review.  Per EDP, improved aeration after DuoNeb and p.o. prednisone.  She was also given home antihypertensive meds and hospital service was called for admission.  ? ?Review of Systems: As mentioned in the history of present illness. All other systems reviewed and are negative. ?Past Medical History:   ?Diagnosis Date  ? Asthma   ? CAD (coronary artery disease) 06/06/2019  ? Hypertension   ? MI (myocardial infarction) ([place])   ? Pyloric stenosis   ? Vocal cord paralysis 06/06/2019  ? ?Past Surgical History:  ?Procedure Laterality Date  ? APPENDECTOMY    ? cervix    ? novashore  ? CHOLECYSTECTOMY    ? MEDIAL PARTIAL KNEE REPLACEMENT    ? ?Social History:  reports that she has been smoking cigarettes. She has a 10.00 pack-year smoking history. She has never used smokeless tobacco. She reports that she does not drink alcohol and does not use drugs. ? ?Allergies  ?Allergen Reactions  ? Demerol  [Meperidine Hcl] Hives  ? Magnesium Sulfate Anaphylaxis  ?  Per patient on 05/16/15, said her heart stopped when she received this medication.  ? Meperidine Hives  ? Albuterol Nausea And Vomiting  ? Gadolinium Derivatives Hives and Nausea And Vomiting  ? Other Other (See Comments)  ?  Macadamia nuts - SOB  ? Pirbuterol Other (See Comments)  ?  Headache ?  ? ? ?Family History  ?Problem Relation Age of Onset  ? Allergies Mother   ? Asthma Mother   ? Asthma Father   ? Stomach cancer Maternal Grandmother   ? ? ?Prior to Admission medications   ?Medication Sig Start Date End Date Taking? Authorizing Provider  ?albuterol (VENTOLIN HFA) 108 (90 Base) MCG/ACT inhaler Inhale 2 puffs into the lungs every 6 (six) hours as needed for wheezing  or shortness of breath. 06/08/19  Yes Eugenie Filler, MD  ?aspirin-acetaminophen-caffeine [facility] MIGRAINE) [phone] MG tablet Take 2 tablets by mouth every 6 (six) hours as needed for headache.    Yes [provider]  ?lisinopril (ZESTRIL) 5 MG tablet Take 5 mg by mouth daily as needed (high blood pressure). 09/23/21  Yes [provider]  ?pseudoephedrine (SUDAFED) 30 MG tablet Take 30 mg by mouth every 4 (four) hours as needed for congestion.   Yes [provider]  ?SYMBICORT 160-4.5 MCG/ACT inhaler 2 puffs daily. 07/28/21  Yes [provider]  ?amLODipine  (NORVASC) 10 MG tablet Take 1 tablet (10 mg total) by mouth daily. ?Patient not taking: Reported on 11/15/2021 06/09/19   Eugenie Filler, MD  ?fluticasone [facility]) 50 MCG/ACT nasal spray Place 2 sprays into both nostrils daily. ?Patient not taking: Reported on 11/15/2021 06/08/19   Eugenie Filler, MD  ?fluticasone furoate-vilanterol (BREO ELLIPTA) 200-25 MCG/INH AEPB Inhale 1 puff into the lungs daily. ?Patient not taking: Reported on 11/15/2021 06/09/19   Eugenie Filler, MD  ?losartan (COZAAR) 100 MG tablet Take 1 tablet (100 mg total) by mouth daily. ?Patient not taking: Reported on 11/15/2021 06/09/19   Eugenie Filler, MD  ?metoprolol succinate (TOPROL-XL) 100 MG 24 hr tablet Take 1 tablet (100 mg total) by mouth daily. ?Patient not taking: Reported on 11/15/2021 06/08/19   Eugenie Filler, MD  ?nicotine (NICODERM CQ - DOSED IN MG/24 HOURS) 14 mg/24hr patch Place 1 patch (14 mg total) onto the skin daily. ?Patient not taking: Reported on 11/15/2021 06/09/19   Eugenie Filler, MD  ?pantoprazole (PROTONIX) 40 MG tablet Take 1 tablet (40 mg total) by mouth daily at 6 (six) AM. ?Patient not taking: Reported on 11/15/2021 06/09/19   Eugenie Filler, MD  ?umeclidinium bromide (INCRUSE ELLIPTA) 62.5 MCG/INH AEPB Inhale 1 puff into the lungs daily. ?Patient not taking: Reported on 11/15/2021 06/09/19   Eugenie Filler, MD  ? ? ?Physical Exam: ?Vitals:  ? 11/15/21 1453 11/15/21 1540 11/15/21 1600 11/15/21 1630  ?BP: (!) 158/97 (!) 162/121 (!) 189/111 (!) 224/153  ?Pulse: (!) 107 95 100 92  ?Resp: 18 (!) 27 (!) 27 17  ?Temp: 98 ?F (36.7 ?C)     ?TempSrc: Oral     ?SpO2: 93% 98% 98% 97%  ? ?GENERAL: No apparent distress.  Nontoxic. ?HEENT: MMM.  Vision and hearing grossly intact.  ?NECK: Supple.  No apparent JVD.  ?RESP: Notable work of breathing.  Hardly speaks in full sentence.  Rhonchi bilaterally. ?CVS:  RRR. Heart sounds normal.  ?ABD/GI/GU: BS+. Abd soft, NTND.  ?MSK/EXT:  Moves extremities. No apparent  deformity. No edema.  ?SKIN: no apparent skin lesion or wound ?NEURO: Awake and alert. Oriented appropriately.  No apparent focal neuro deficit. ?PSYCH: Calm. Normal affect.  ? ?Data Reviewed: ?See HPI ? ?Assessment and Plan: ?* Asthma exacerbation ?Likely provoked by viral URI.  COVID-19 and influenza PCR nonreactive.  She has rhonchi with significant work of breathing on my exam.  Barely speaks in full sentences. ?-Check full RVP ?-IV Solu-Medrol 60 mg twice daily ?-Brovana, Pulmicort and Yupelri ?-DuoNeb every 4 hours as needed ?-Mucolytic's and antitussive ?-Encourage smoking cessation. ? ?Hypertensive urgency ?BP went from 158/97-220/153.  Doubt accuracy given arm size.  Likely due to noncompliance with meds. ?-Resume home amlodipine ?-Start low-dose losartan ?-P.o. hydralazine as needed ?-Avoid beta-blockers given asthma. ?-May benefit from sleep study. ? ?Hypokalemia ?K3.2.  P.o. KCl 40x2.  Check  magnesium. ? ?Cigarette smoker ?Admits to smoking about half a pack a day. ?-Encouraged cessation. ?-Nicotine patch ? ?Morbid (severe) obesity due to excess calories ([place]) ?There is no height or weight on file to calculate BMI. ?-Encourage lifestyle change to lose weight ? ? ? ? ? Advance Care Planning:   Code Status: Full Code  ? ?Consults: None ? ?Family Communication: None at bedside ? ?Severity of Illness: ?The appropriate patient status for this patient is OBSERVATION. Observation status is judged to be reasonable and necessary in order to provide the required intensity of service to ensure the patient's safety. The patient's presenting symptoms, physical exam findings, and initial radiographic and laboratory data in the context of their medical condition is felt to place them at decreased risk for further clinical deterioration. Furthermore, it is anticipated that the patient will be medically stable for discharge from the hospital within 2 midnights of admission.  ? ?Author: ?Mercy Riding, MD ?11/15/2021 5:52  PM ? ?For on call review [email or web address].  ?

## 2021-11-15 NOTE — Assessment & Plan Note (Addendum)
Likely provoked by rhinovirus infection and ongoing cigarette smoking.  Improved with IV steroid and nebulizers.  She likes to go home.  ?-Discharge on p.o. prednisone, home Symbicort and albuterol. ?-Added Incruse Ellipta and authorized pharmacy to change to Spiriva if cheaper ?-TOC assisted with PCP and medication. ?-Encouraged to quit smoking cigarettes. ?

## 2021-11-15 NOTE — Assessment & Plan Note (Addendum)
Body mass index is 46.82 kg/m?. ?-Encourage lifestyle change to lose weight ?

## 2021-11-16 DIAGNOSIS — B348 Other viral infections of unspecified site: Secondary | ICD-10-CM

## 2021-11-16 DIAGNOSIS — J4541 Moderate persistent asthma with (acute) exacerbation: Secondary | ICD-10-CM | POA: Diagnosis not present

## 2021-11-16 DIAGNOSIS — R739 Hyperglycemia, unspecified: Secondary | ICD-10-CM

## 2021-11-16 DIAGNOSIS — F17210 Nicotine dependence, cigarettes, uncomplicated: Secondary | ICD-10-CM | POA: Diagnosis not present

## 2021-11-16 DIAGNOSIS — I160 Hypertensive urgency: Secondary | ICD-10-CM | POA: Diagnosis not present

## 2021-11-16 LAB — CBC
HCT: 41.4 % (ref 36.0–46.0)
Hemoglobin: 13.6 g/dL (ref 12.0–15.0)
MCH: 30.2 pg (ref 26.0–34.0)
MCHC: 32.9 g/dL (ref 30.0–36.0)
MCV: 91.8 fL (ref 80.0–100.0)
Platelets: 443 10*3/uL — ABNORMAL HIGH (ref 150–400)
RBC: 4.51 MIL/uL (ref 3.87–5.11)
RDW: 13.9 % (ref 11.5–15.5)
WBC: 8.9 10*3/uL (ref 4.0–10.5)
nRBC: 0 % (ref 0.0–0.2)

## 2021-11-16 LAB — RENAL FUNCTION PANEL
Albumin: 3.7 g/dL (ref 3.5–5.0)
Anion gap: 11 (ref 5–15)
BUN: 14 mg/dL (ref 6–20)
CO2: 27 mmol/L (ref 22–32)
Calcium: 9.7 mg/dL (ref 8.9–10.3)
Chloride: 104 mmol/L (ref 98–111)
Creatinine, Ser: 0.54 mg/dL (ref 0.44–1.00)
GFR, Estimated: >60 mL/min (ref >60)
Glucose, Bld: 247 mg/dL — ABNORMAL HIGH (ref 70–99)
Phosphorus: 2.4 mg/dL — ABNORMAL LOW (ref 2.5–4.6)
Potassium: 4.1 mmol/L (ref 3.5–5.1)
Sodium: 142 mmol/L (ref 135–145)

## 2021-11-16 LAB — RESPIRATORY PANEL BY PCR

## 2021-11-16 LAB — MAGNESIUM: Magnesium: 2.2 mg/dL (ref 1.7–2.4)

## 2021-11-16 LAB — HIV ANTIBODY (ROUTINE TESTING W REFLEX): HIV Screen 4th Generation wRfx: NONREACTIVE

## 2021-11-16 MED ORDER — ENOXAPARIN SODIUM 40 MG/0.4ML IJ SOSY
40.0000 mg | PREFILLED_SYRINGE | Freq: Every day | INTRAMUSCULAR | Status: DC
  Administered 2021-11-16: 40 mg via SUBCUTANEOUS
  Filled 2021-11-16 (×2): qty 0.4

## 2021-11-16 MED ORDER — ALBUTEROL SULFATE (2.5 MG/3ML) 0.083% IN NEBU
2.5000 mg | INHALATION_SOLUTION | Freq: Three times a day (TID) | RESPIRATORY_TRACT | Status: DC
  Administered 2021-11-16 – 2021-11-17 (×2): 2.5 mg via RESPIRATORY_TRACT
  Filled 2021-11-16: qty 3

## 2021-11-16 MED ORDER — AMLODIPINE BESYLATE 10 MG PO TABS
10.0000 mg | ORAL_TABLET | Freq: Every day | ORAL | Status: DC
  Administered 2021-11-16 – 2021-11-17 (×2): 10 mg via ORAL
  Filled 2021-11-16 (×2): qty 1

## 2021-11-16 NOTE — Plan of Care (Signed)

## 2021-11-16 NOTE — Progress Notes (Signed)
SATURATION QUALIFICATIONS: (This note is used to comply with regulatory documentation for home oxygen) ? ?Patient Saturations on Room Air at Rest = 94% ? ?Patient Saturations on Room Air while Ambulating = 91% ? ?Please briefly explain why patient needs home oxygen: ?

## 2021-11-16 NOTE — Progress Notes (Signed)
?PROGRESS NOTE ? ?Julia Ingram KGU:[id number] DOB: [birth year]  ? ?PCP: Patient, No Pcp Per (Inactive) ? ?Patient is from: Home.  ? ?DOA: 11/15/2021 LOS: 0 ? ?Chief complaints ?Chief Complaint  ?Patient presents with  ? Shortness of Breath  ?  ? ?Brief Narrative / Interim history: ?46 year old F with PMH of asthma, vocal cord paralysis, CAD, morbid obesity, HTN and tobacco use disorder presenting with shortness of breath, nasal congestion, cough and dyspnea on exertion, and admitted for asthma exacerbation and hypertensive urgency.  Full RVP positive for rhinovirus.  ? ?Subjective: ?Seen and examined earlier this morning.  No major events overnight of this morning.  Still with shortness of breath.  Cough is starting to get loose.  She denies chest pain.  Feels winded with minimal exertion. ? ?Objective: ?Vitals:  ? 11/16/21 0100 11/16/21 0439 11/16/21 0751 11/16/21 0813  ?BP: 140/76 (!) 148/104  (!) 174/92  ?Pulse: (!) 107 (!) 102  100  ?Resp: 19 19  (!) 22  ?Temp: 98.2 ?F (36.8 ?C) 98.1 ?F (36.7 ?C)  97.8 ?F (36.6 ?C)  ?TempSrc: Oral Oral  Oral  ?SpO2: 97% 97% 95% 98%  ?Weight:      ?Height:      ? ? ?Examination: ? ?GENERAL: No apparent distress.  Nontoxic. ?HEENT: MMM.  Vision and hearing grossly intact.  ?NECK: Supple.  No apparent JVD.  ?RESP: 98% on 2 L.  Not able to speak in full sentence without catching her breath.  Rhonchi and diminished aeration over lower lung fields. ?CVS:  RRR. Heart sounds normal.  ?ABD/GI/GU: BS+. Abd soft, NTND.  ?MSK/EXT:  Moves extremities. No apparent deformity. No edema.  ?SKIN: no apparent skin lesion or wound ?NEURO: Awake, alert and oriented appropriately.  No apparent focal neuro deficit. ?PSYCH: Calm. Normal affect.  ? ?Procedures:  ?None ? ?Microbiology summarized: ?COVID-19 and influenza PCR nonreactive. ?Full RVP positive for rhinovirus. ? ?Assessment and Plan: ?* Asthma exacerbation ?Likely provoked by rhinovirus infection and ongoing cigarette smoking.  Still with  rhonchi, diminished bibasilar air movement and work of breathing. ?-Continue IV Solu-Medrol 60 mg twice daily ?-Continue Brovana, Pulmicort and Yupelri ?-Continue DuoNeb every 4 hours as needed ?-Continue mucolytic's and antitussive ?-Encouraged smoking cessation. ?-Wean off oxygen.  Assess ambulatory saturation ?-Continue droplet precaution ? ?Hypertensive urgency ?BP as high as 220/153 in ED.  Patient not taking antihypertensive meds at home.  Blood pressure improved this morning.  She is at risk for sleep apnea.  ?-Continue amlodipine 10 mg daily ?-Continue losartan 50 mg daily ?-P.o. hydralazine as needed ?-Avoid beta-blockers given asthma. ?-Consider sleep study outpatient. ? ?Hyperglycemia ?No history of diabetes.  Likely from steroid. ?-Check hemoglobin A1c in the morning ? ?Hypokalemia ?Resolved. ? ?Cigarette smoker ?Admits to smoking about half a pack a day. ?-Encouraged cessation. ?-Nicotine patch ? ?Morbid (severe) obesity due to excess calories ([facility]) ?Body mass index is 46.82 kg/m?. ?-Encourage lifestyle change to lose weight ? ? ? ?DVT prophylaxis:  ?enoxaparin (LOVENOX) injection 40 mg Start: 11/16/21 1000 ? ?Code Status: Full code ?Family Communication: Patient and/or RN. Available if any question.  ?Level of care: Progressive.  Can transition to MedSurg bed. ?Status is: Observation ?The patient will require care spanning > 2 midnights and should be moved to inpatient because: Due to asthma exacerbation with significant respiratory distress ? ? ?Final disposition: Home once medically stable. ? ?Consultants:  ?None ? ?Sch Meds:  ?Scheduled Meds: ? amLODipine  10 mg Oral Daily  ? arformoterol  15 mcg Nebulization  BID  ? budesonide (PULMICORT) nebulizer solution  0.25 mg Nebulization BID  ? dextromethorphan-guaiFENesin  1 tablet Oral BID  ? enoxaparin (LOVENOX) injection  40 mg Subcutaneous Daily  ? losartan  50 mg Oral Daily  ? methylPREDNISolone (SOLU-MEDROL) injection  60 mg Intravenous Q12H  ?  pantoprazole  40 mg Oral Daily  ? revefenacin  175 mcg Nebulization Daily  ? ?Continuous Infusions: ?PRN Meds:.acetaminophen **OR** acetaminophen, hydrALAZINE, ipratropium-albuterol, ondansetron **OR** ondansetron (ZOFRAN) IV, polyethylene glycol, senna-docusate ? ?Antimicrobials: ?Anti-infectives (From admission, onward)  ? ? None  ? ?  ? ? ? ?I have personally reviewed the following labs and images: ?CBC: ?Recent Labs  ?Lab 11/15/21 ?1523 11/16/21 ?0404  ?WBC 9.8 8.9  ?NEUTROABS 6.5  --   ?HGB 12.7 13.6  ?HCT 39.0 41.4  ?MCV 91.8 91.8  ?PLT 383 443*  ? ?BMP &GFR ?Recent Labs  ?Lab 11/15/21 ?1523 11/16/21 ?0404  ?NA 140 142  ?K 3.2* 4.1  ?CL 104 104  ?CO2 29 27  ?GLUCOSE 129* 247*  ?BUN 8 14  ?CREATININE 0.55 0.54  ?CALCIUM 8.6* 9.7  ?MG 2.2 2.2  ?PHOS  --  2.4*  ? ?Estimated Creatinine Clearance: 133.4 mL/min (by C-G formula based on SCr of 0.54 mg/dL). ?Liver & Pancreas: ?Recent Labs  ?Lab 11/16/21 ?0404  ?ALBUMIN 3.7  ? ?No results for input(s): LIPASE, AMYLASE in the last 168 hours. ?No results for input(s): AMMONIA in the last 168 hours. ?Diabetic: ?No results for input(s): HGBA1C in the last 72 hours. ?No results for input(s): GLUCAP in the last 168 hours. ?Cardiac Enzymes: ?No results for input(s): CKTOTAL, CKMB, CKMBINDEX, TROPONINI in the last 168 hours. ?No results for input(s): PROBNP in the last 8760 hours. ?Coagulation Profile: ?No results for input(s): INR, PROTIME in the last 168 hours. ?Thyroid Function Tests: ?No results for input(s): TSH, T4TOTAL, FREET4, T3FREE, THYROIDAB in the last 72 hours. ?Lipid Profile: ?No results for input(s): CHOL, HDL, LDLCALC, TRIG, CHOLHDL, LDLDIRECT in the last 72 hours. ?Anemia Panel: ?No results for input(s): VITAMINB12, FOLATE, FERRITIN, TIBC, IRON, RETICCTPCT in the last 72 hours. ?Urine analysis: ?No results found for: COLORURINE, APPEARANCEUR, LABSPEC, PHURINE, GLUCOSEU, HGBUR, BILIRUBINUR, KETONESUR, PROTEINUR, UROBILINOGEN, NITRITE, LEUKOCYTESUR ?Sepsis  Labs: ?Invalid input(s): PROCALCITONIN, LACTICIDVEN ? ?Microbiology: ?Recent Results (from the past 240 hour(s))  ?Resp Panel by RT-PCR (Flu A&B, Covid) Nasopharyngeal Swab     Status: None  ? Collection Time: 11/15/21  3:30 PM  ? Specimen: Nasopharyngeal Swab; Nasopharyngeal(NP) swabs in vial transport medium  ?Result Value Ref Range Status  ? SARS Coronavirus 2 by RT PCR NEGATIVE NEGATIVE Final  ?  Comment: (NOTE) ?SARS-CoV-2 target nucleic acids are NOT DETECTED. ? ?The SARS-CoV-2 RNA is generally detectable in upper respiratory ?specimens during the acute phase of infection. The lowest ?concentration of SARS-CoV-2 viral copies this assay can detect is ?138 copies/mL. A negative result does not preclude SARS-Cov-2 ?infection and should not be used as the sole basis for treatment or ?other patient management decisions. A negative result may occur with  ?improper specimen collection/handling, submission of specimen other ?than nasopharyngeal swab, presence of viral mutation(s) within the ?areas targeted by this assay, and inadequate number of viral ?copies(<138 copies/mL). A negative result must be combined with ?clinical observations, patient history, and epidemiological ?information. The expected result is Negative. ? ?Fact Sheet for Patients:  ?BloggerCourse.com ? ?Fact Sheet for Healthcare Providers:  ?SeriousBroker.it ? ?This test is no t yet approved or cleared by the [place] FDA and  ?has been  authorized for detection and/or diagnosis of SARS-CoV-2 by ?FDA under an Emergency Use Authorization (EUA). This EUA will remain  ?in effect (meaning this test can be used) for the duration of the ?COVID-19 declaration under Section 564(b)(1) of the Act, 21 ?U.S.C.section 360bbb-3(b)(1), unless the authorization is terminated  ?or revoked sooner.  ? ? ?  ? Influenza A by PCR NEGATIVE NEGATIVE Final  ? Influenza B by PCR NEGATIVE NEGATIVE Final  ?  Comment:  (NOTE) ?The Xpert Xpress SARS-CoV-2/FLU/RSV plus assay is intended as an aid ?in the diagnosis of influenza from Nasopharyngeal swab specimens and ?should not be used as a sole basis for treatment. Nasal washings and ?aspirat

## 2021-11-16 NOTE — Assessment & Plan Note (Addendum)
No history of diabetes.  Likely from steroid.  A1c 6.4%. ?-Encourage lifestyle change to lose weight ?-May consider p.o. metformin ?

## 2021-11-17 DIAGNOSIS — J4541 Moderate persistent asthma with (acute) exacerbation: Secondary | ICD-10-CM | POA: Diagnosis not present

## 2021-11-17 DIAGNOSIS — F17210 Nicotine dependence, cigarettes, uncomplicated: Secondary | ICD-10-CM | POA: Diagnosis not present

## 2021-11-17 DIAGNOSIS — R739 Hyperglycemia, unspecified: Secondary | ICD-10-CM | POA: Diagnosis not present

## 2021-11-17 DIAGNOSIS — I160 Hypertensive urgency: Secondary | ICD-10-CM | POA: Diagnosis not present

## 2021-11-17 LAB — RENAL FUNCTION PANEL
Albumin: 3.5 g/dL (ref 3.5–5.0)
Anion gap: 7 (ref 5–15)
BUN: 20 mg/dL (ref 6–20)
CO2: 29 mmol/L (ref 22–32)
Calcium: 9.5 mg/dL (ref 8.9–10.3)
Chloride: 107 mmol/L (ref 98–111)
Creatinine, Ser: 0.68 mg/dL (ref 0.44–1.00)
GFR, Estimated: >60 mL/min (ref >60)
Glucose, Bld: 235 mg/dL — ABNORMAL HIGH (ref 70–99)
Phosphorus: 3.1 mg/dL (ref 2.5–4.6)
Potassium: 4.3 mmol/L (ref 3.5–5.1)
Sodium: 143 mmol/L (ref 135–145)

## 2021-11-17 LAB — HEMOGLOBIN A1C
Hgb A1c MFr Bld: 6.4 % — ABNORMAL HIGH (ref 4.8–5.6)
Mean Plasma Glucose: 136.98 mg/dL

## 2021-11-17 LAB — MAGNESIUM: Magnesium: 2.3 mg/dL (ref 1.7–2.4)

## 2021-11-17 MED ORDER — PREDNISONE 50 MG PO TABS: 50.0000 mg | ORAL_TABLET | Freq: Every day | ORAL | 0 refills | Status: DC

## 2021-11-17 MED ORDER — AMLODIPINE BESYLATE 10 MG PO TABS
10.0000 mg | ORAL_TABLET | Freq: Every day | ORAL | 1 refills | Status: DC
Start: 2021-11-17 — End: 2023-08-26

## 2021-11-17 MED ORDER — PANTOPRAZOLE SODIUM 40 MG PO TBEC: 40.0000 mg | DELAYED_RELEASE_TABLET | Freq: Every day | ORAL | 1 refills | Status: DC

## 2021-11-17 MED ORDER — DM-GUAIFENESIN ER 30-600 MG PO TB12: 1.0000 | ORAL_TABLET | Freq: Two times a day (BID) | ORAL | 0 refills | Status: DC

## 2021-11-17 MED ORDER — NICOTINE 14 MG/24HR TD PT24: 14.0000 mg | MEDICATED_PATCH | Freq: Every day | TRANSDERMAL | 0 refills | Status: DC

## 2021-11-17 MED ORDER — UMECLIDINIUM BROMIDE 62.5 MCG/INH IN AEPB: 1.0000 | INHALATION_SPRAY | Freq: Every day | RESPIRATORY_TRACT | 1 refills | Status: DC

## 2021-11-17 MED ORDER — ALBUTEROL SULFATE (2.5 MG/3ML) 0.083% IN NEBU: 2.5000 mg | INHALATION_SOLUTION | Freq: Four times a day (QID) | RESPIRATORY_TRACT | 2 refills | Status: DC | PRN

## 2021-11-17 MED ORDER — LOSARTAN POTASSIUM 100 MG PO TABS: 100.0000 mg | ORAL_TABLET | Freq: Every day | ORAL | 1 refills | Status: AC

## 2021-11-17 NOTE — Progress Notes (Signed)
Provided and discussed dicharge instructions. Addressed all questions and concerns. IV removed intact.  Dazja Houchin N Teal Bontrager  

## 2021-11-17 NOTE — Plan of Care (Signed)

## 2021-11-17 NOTE — Discharge Summary (Signed)
? ?Physician Discharge Summary  ?Julia Ingram SFK:[id number] DOB: 02-Dec-1975 DOA: 11/15/2021 ? ?PCP: Patient, No Pcp Per (Inactive) ? ?Admit date: 11/15/2021 ?Discharge date: 11/17/2021 ?Admitted From: Home ?Disposition: Home ?Recommendations for Outpatient Follow-up:  ?Follow up with PCP in 1 to 2 weeks. ?Reassess blood pressure and adjust medications as appropriate. ?Consider referral to sleep clinic for sleep study ?Please obtain CBC/BMP/Mag at follow up ?Please follow up on the following pending results: None ? ?Home Health: Not indicated ?Equipment/Devices: Not indicated ? ?Discharge Condition: Stable ?CODE STATUS: Full code ? Follow-up Information   ? ? [facility] Follow up.   ?Specialty: Internal Medicine ?Why: Appointment May 1 at 2:00 PM. Please keep this appointment. Thanks ?Contact information: ?[address] ?[place] [address] ?[phone] ? ?  ?  ? ?  ?  ? ?  ? ? ?Hospital course ?46 year old F with PMH of asthma, vocal cord paralysis, CAD, morbid obesity, HTN and tobacco use disorder presenting with shortness of breath, nasal congestion, cough and dyspnea on exertion, and admitted for asthma exacerbation and hypertensive urgency.  Full RVP positive for rhinovirus. ? ?Eventually, patient's symptoms improved and she felt well and ready to go home.  She is discharged on p.o. prednisone, home Symbicort and albuterol.  She was encouraged to quit smoking cigarettes.  Transition of care team assisted with PCP and medications. ? ?  ? ?See individual problem list below for more on hospital course. ? ?Problems addressed during this hospitalization ?Problem  ?Asthma Exacerbation  ?Hypertensive Urgency  ? Trial off acei 09/18/2016 due to pseudowheeze  ? ?  ?Hyperglycemia  ?Morbid (Severe) Obesity Due to Excess Calories (Hcc)  ?Cigarette Smoker  ?Hypokalemia (Resolved)  ?  ?Assessment and Plan: ?* Asthma exacerbation ?Likely provoked by rhinovirus infection and ongoing cigarette  smoking.  Improved with IV steroid and nebulizers.  She likes to go home.  ?-Discharge on p.o. prednisone, home Symbicort and albuterol. ?-Added Incruse Ellipta and authorized pharmacy to change to Spiriva if cheaper ?-TOC assisted with PCP and medication. ?-Encouraged to quit smoking cigarettes. ? ?Hypertensive urgency ?BP as high as 220/153 in ED.  Patient not taking antihypertensive meds at home.  Blood pressure improved. ?-Renewed Rx for amlodipine and losartan. ?-She could benefit from sleep study outpatient ? ?Hyperglycemia ?No history of diabetes.  Likely from steroid.  A1c 6.4%. ?-Encourage lifestyle change to lose weight ?-May consider p.o. metformin ? ?Cigarette smoker ?Admits to smoking about half a pack a day. ?-Encouraged cessation and provided resources. ? ?Morbid (severe) obesity due to excess calories (HCC) ?Body mass index is 46.82 kg/m?. ?-Encourage lifestyle change to lose weight ? ? ? ? ?  ?  ?  ?  ? ?  ? ?Vital signs ?Vitals:  ? 11/16/21 1708 11/16/21 2037 11/17/21 0826 11/17/21 0946  ?BP:  (!) 156/89  (!) 184/110  ?Pulse:  (!) 103    ?Temp:  98.1 ?F (36.7 ?C)    ?Resp:  18    ?Height:      ?Weight:      ?SpO2: 97% 95% 94%   ?TempSrc:  Oral    ?BMI (Calculated):      ?  ? ?Discharge exam ? ?GENERAL: No apparent distress.  Nontoxic. ?HEENT: MMM.  Vision and hearing grossly intact.  ?NECK: Supple.  No apparent JVD.  ?RESP: On room air.  No IWOB.  Fair aeration bilaterally. ?CVS:  RRR. Heart sounds normal.  ?ABD/GI/GU: BS+. Abd soft, NTND.  ?MSK/EXT:  Moves  extremities. No apparent deformity. No edema.  ?SKIN: no apparent skin lesion or wound ?NEURO: Awake and alert. Oriented appropriately.  No apparent focal neuro deficit. ?PSYCH: Calm. Normal affect.  ? ?Discharge Instructions ?Discharge Instructions   ? ? Call MD for:  difficulty breathing, headache or visual disturbances   Complete by: As directed ?  ? Call MD for:  extreme fatigue   Complete by: As directed ?  ? Diet - low sodium heart healthy    Complete by: As directed ?  ? Discharge instructions   Complete by: As directed ?  ? It has been a pleasure taking care of you! ? ?You were hospitalized due to asthma exacerbation in the setting of rhinovirus infection (common cold).  Your symptoms improved to the point we think it is safe to let you go home and follow-up with your doctors.  We are discharging you on prednisone for the next 4 days.  Continue your Symbicort.  Use albuterol only as needed.  We also recommend you take your blood pressure medications as prescribed.  We strongly recommend you quit smoking cigarettes. ? ?It is important that you quit smoking cigarettes.  You may use nicotine patch to help you quit smoking.  Nicotine patch is available over-the-counter.  You may also discuss other options to help you quit smoking with your primary care doctor. You can also talk to professional counselors at 1-800-QUIT-NOW 6705568514) for free smoking cessation counseling. ? ? ? ? ?Take care,  ? Increase activity slowly   Complete by: As directed ?  ? ?  ? ?Allergies as of 11/17/2021   ? ?   Reactions  ? Demerol  [meperidine Hcl] Hives  ? Magnesium Sulfate Anaphylaxis  ? Per patient on 05/16/15, said her heart stopped when she received this medication.  ? Meperidine Hives  ? Albuterol Nausea And Vomiting  ? Gadolinium Derivatives Hives, Nausea And Vomiting  ? Other Other (See Comments)  ? Macadamia nuts - SOB  ? Pirbuterol Other (See Comments)  ? Headache  ? ?  ? ?  ?Medication List  ?  ? ?STOP taking these medications   ? ?fluticasone furoate-vilanterol 200-25 MCG/INH Aepb ?Commonly known as: BREO ELLIPTA ?  ?lisinopril 5 MG tablet ?Commonly known as: ZESTRIL ?  ?metoprolol succinate 100 MG 24 hr tablet ?Commonly known as: TOPROL-XL ?  ?pseudoephedrine 30 MG tablet ?Commonly known as: SUDAFED ?  ? ?  ? ?TAKE these medications   ? ?albuterol 108 (90 Base) MCG/ACT inhaler ?Commonly known as: VENTOLIN HFA ?Inhale 2 puffs into the lungs every 6 (six)  hours as needed for wheezing or shortness of breath. ?What changed: Another medication with the same name was added. Make sure you understand how and when to take each. ?  ?albuterol (2.5 MG/3ML) 0.083% nebulizer solution ?Commonly known as: PROVENTIL ?Take 3 mLs (2.5 mg total) by nebulization every 6 (six) hours as needed for wheezing or shortness of breath. ?What changed: You were already taking a medication with the same name, and this prescription was added. Make sure you understand how and when to take each. ?  ?amLODipine 10 MG tablet ?Commonly known as: NORVASC ?Take 1 tablet (10 mg total) by mouth daily. ?  ?aspirin-acetaminophen-caffeine 250-250-65 MG tablet ?Commonly known as: EXCEDRIN MIGRAINE ?Take 2 tablets by mouth every 6 (six) hours as needed for headache. ?  ?dextromethorphan-guaiFENesin 30-600 MG 12hr tablet ?Commonly known as: MUCINEX DM ?Take 1 tablet by mouth 2 (two) times daily. ?  ?fluticasone 50  MCG/ACT nasal spray ?Commonly known as: FLONASE ?Place 2 sprays into both nostrils daily. ?  ?losartan 100 MG tablet ?Commonly known as: COZAAR ?Take 1 tablet (100 mg total) by mouth daily. ?  ?nicotine 14 mg/24hr patch ?Commonly known as: NICODERM CQ - dosed in mg/24 hours ?Place 1 patch (14 mg total) onto the skin daily. ?  ?pantoprazole 40 MG tablet ?Commonly known as: PROTONIX ?Take 1 tablet (40 mg total) by mouth daily at 6 (six) AM. ?  ?predniSONE 50 MG tablet ?Commonly known as: DELTASONE ?Take 1 tablet (50 mg total) by mouth daily. ?  ?Symbicort 160-4.5 MCG/ACT inhaler ?Generic drug: budesonide-formoterol ?2 puffs daily. ?  ?umeclidinium bromide 62.5 MCG/INH Aepb ?Commonly known as: INCRUSE ELLIPTA ?Inhale 1 puff into the lungs daily. ?  ? ?  ? ? ?Consultations: ?None ? ?Procedures/Studies: ? ? ?DG Chest Port 1 View ? ?Result Date: 11/15/2021 ?CLINICAL DATA:  Shortness of breath and congestion. EXAM: PORTABLE CHEST 1 VIEW COMPARISON:  Chest radiograph 06/06/2019 FINDINGS: The cardiac silhouette  is borderline enlarged. Lung volumes are low with mild accentuation of the interstitial markings. No confluent airspace opacity, overt pulmonary edema, sizable pleural effusion, or pneumothorax is iden

## 2021-11-17 NOTE — TOC Progression Note (Signed)
Transition of Care (TOC) - Progression Note  ? ? ?Patient Details  ?Name: Julia Ingram ?MRN: [id number] ?Date of Birth: 04-24-1976 ? ?Transition of Care (TOC) CM/SW Contact  ?Geni Bers, RN ?Phone Number: ?11/17/2021, 10:07 AM ? ?Clinical Narrative:    ? ?Spoke with pt concerning PCP. Pt agreed to [facility] for an appointment on May 1 at 1400. Encouraged pt to keep this appointment.  ? ? ?  ?  ? ?Expected Discharge Plan and Services ?  ?  ?  ?  ?  ?Expected Discharge Date: 11/17/21               ?  ?  ?  ?  ?  ?  ?  ?  ?  ?  ? ? ?Social Determinants of Health (SDOH) Interventions ?  ? ?Readmission Risk Interventions ?   ? View : No data to display.  ?  ?  ?  ? ? ?

## 2021-12-04 ENCOUNTER — Ambulatory Visit (INDEPENDENT_AMBULATORY_CARE_PROVIDER_SITE_OTHER): Payer: Self-pay | Admitting: Nurse Practitioner

## 2021-12-04 VITALS — BP 146/87 | HR 88 | Temp 98.4°F | Ht 67.0 in | Wt 323.2 lb

## 2021-12-04 DIAGNOSIS — H04129 Dry eye syndrome of unspecified lacrimal gland: Secondary | ICD-10-CM

## 2021-12-04 DIAGNOSIS — R058 Other specified cough: Secondary | ICD-10-CM

## 2021-12-04 DIAGNOSIS — Z0000 Encounter for general adult medical examination without abnormal findings: Secondary | ICD-10-CM

## 2021-12-04 DIAGNOSIS — Z09 Encounter for follow-up examination after completed treatment for conditions other than malignant neoplasm: Secondary | ICD-10-CM

## 2021-12-04 MED ORDER — PATADAY 0.7 % OP SOLN: 1.0000 [drp] | Freq: Every day | OPHTHALMIC | 0 refills | Status: DC

## 2021-12-04 MED ORDER — PATADAY 0.7 % OP SOLN: 2.0000 [drp] | Freq: Two times a day (BID) | OPHTHALMIC | 0 refills | Status: DC | PRN

## 2021-12-04 MED ORDER — SYSTANE 0.4-0.3 % OP SOLN: 2.0000 [drp] | Freq: Two times a day (BID) | OPHTHALMIC | 0 refills | Status: DC | PRN

## 2021-12-04 MED ORDER — AZITHROMYCIN 250 MG PO TABS: ORAL_TABLET | ORAL | 0 refills | Status: DC

## 2021-12-04 MED ORDER — DM-GUAIFENESIN ER 30-600 MG PO TB12: 1.0000 | ORAL_TABLET | Freq: Two times a day (BID) | ORAL | 0 refills | Status: AC

## 2021-12-04 MED ORDER — PREDNISONE 50 MG PO TABS: 50.0000 mg | ORAL_TABLET | Freq: Every day | ORAL | 0 refills | Status: DC

## 2021-12-04 NOTE — Patient Instructions (Signed)
You were seen today in the [facility] for hospital follow up. Labs were collected, results will be available via MyChart or, if abnormal, you will be contacted by clinic staff. You were prescribed medications, please take as directed. Please follow up in 2 mths for reevaluation of symptoms discussed. ?

## 2021-12-04 NOTE — Progress Notes (Signed)
? ?[facility] ?[address] ?[place]  [address] ?Phone:  [phone]   Fax:  [phone] ?Subjective:  ? Patient ID: Julia Ingram, female    DOB: 1976/06/04, 46 y.o.   MRN: [id number] ? ?Chief Complaint  ?Patient presents with  ? Hospitalization Follow-up  ?  Pt is here for hospital visit. Pt stated she still has a cough and when she cough it hurts her chest.  ? ?HPI ?Julia Ingram 46 y.o. female  has a past medical history of Asthma, CAD (coronary artery disease) (06/06/2019), Hypertension, MI (myocardial infarction) (HCC), Pyloric stenosis, and Vocal cord paralysis (06/06/2019). [facility].  ? ?Patient states she is here today because," this is the appointment they gave me and I still have a cough." Has completed discharge medications. Has had a non productive cough with chest pain. State that she has chest pain with cough only, denies any shortness of breath. Denies any known sick contacts. Has been using albuterol inhaler and Symbicort inhaler with no improvement in symptoms.  ? 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 States that she currently smokes 1/2 PPD. Denies any other concerns today. Currently residing in a motel. ? ?Patient also concerned about dry eye that worsens throughout the day, requesting prescription medication.  States that she received prescription in the past, which was effective in managing symptoms.  ? ?Denies any fatigue, chest pain, shortness of breath, HA or dizziness. Denies any blurred vision, numbness or tingling. ? ? ?Past Medical History:  ?Diagnosis Date  ? Asthma   ? CAD (coronary artery disease) 06/06/2019  ? Hypertension   ? MI (myocardial infarction) (HCC)   ? Pyloric stenosis   ? Vocal cord paralysis 06/06/2019  ? ? ?Past Surgical History:  ?Procedure Laterality Date  ? APPENDECTOMY    ? cervix    ? nov[place]ore  ? CHOLECYSTECTOMY    ? MEDIAL PARTIAL KNEE REPLACEMENT    ? ? ?Family History  ?Problem  Relation Age of Onset  ? Allergies Mother   ? Asthma Mother   ? Asthma Father   ? Stomach cancer Maternal Grandmother   ? ? ?Social History  ? ?Socioeconomic History  ? Marital status: Married  ?  Spouse name: Not on file  ? Number of children: Not on file  ? Years of education: Not on file  ? Highest education level: Not on file  ?Occupational History  ? Not on file  ?Tobacco Use  ? Smoking status: Every Day  ?  Packs/day: 0.50  ?  Years: 20.00  ?  Pack years: 10.00  ?  Types: Cigarettes  ? Smokeless tobacco: Never  ?Vaping Use  ? Vaping Use: Never used  ?Substance and Sexual Activity  ? Alcohol use: No  ? Drug use: No  ? Sexual activity: Yes  ?Other Topics Concern  ? Not on file  ?Social History Narrative  ? Not on file  ? ?Social Determinants of Health  ? ?Financial Resource Strain: Not on file  ?Food Insecurity: Not on file  ?Transportation Needs: Not on file  ?Physical Activity: Not on file  ?Stress: Not on file  ?Social Connections: Not on file  ?Intimate Partner Violence: Not on file  ? ? ?Outpatient Medications Prior to Visit  ?Medication Sig Dispense Refill  ? albuterol (PROVENTIL) (2.5 MG/3ML) 0.083% nebulizer solution Take 3 mLs (2.5 mg total) by nebulization every 6 (six) hours as needed for wheezing or shortness of breath.  75 mL 2  ? albuterol (VENTOLIN HFA) 108 (90 Base) MCG/ACT inhaler Inhale 2 puffs into the lungs every 6 (six) hours as needed for wheezing or shortness of breath. 18 g 0  ? amLODipine (NORVASC) 10 MG tablet Take 1 tablet (10 mg total) by mouth daily. 30 tablet 1  ? aspirin-acetaminophen-caffeine (EXCEDRIN MIGRAINE) 250-250-65 MG tablet Take 2 tablets by mouth every 6 (six) hours as needed for headache.     ? losartan (COZAAR) 100 MG tablet Take 1 tablet (100 mg total) by mouth daily. 30 tablet 1  ? nicotine (NICODERM CQ - DOSED IN MG/24 HOURS) 14 mg/24hr patch Place 1 patch (14 mg total) onto the skin daily. 28 patch 0  ? SYMBICORT 160-4.5 MCG/ACT inhaler 2 puffs daily.    ?  fluticasone (FLONASE) 50 MCG/ACT nasal spray Place 2 sprays into both nostrils daily. (Patient not taking: Reported on 12/04/2021) 16 g 0  ? pantoprazole (PROTONIX) 40 MG tablet Take 1 tablet (40 mg total) by mouth daily at 6 (six) AM. (Patient not taking: Reported on 12/04/2021) 30 tablet 1  ? umeclidinium bromide (INCRUSE ELLIPTA) 62.5 MCG/INH AEPB Inhale 1 puff into the lungs daily. (Patient not taking: Reported on 12/04/2021) 30 each 1  ? dextromethorphan-guaiFENesin (MUCINEX DM) 30-600 MG 12hr tablet Take 1 tablet by mouth 2 (two) times daily. (Patient not taking: Reported on 12/04/2021) 10 tablet 0  ? predniSONE (DELTASONE) 50 MG tablet Take 1 tablet (50 mg total) by mouth daily. (Patient not taking: Reported on 12/04/2021) 4 tablet 0  ? ?No facility-administered medications prior to visit.  ? ? ?Allergies  ?Allergen Reactions  ? Demerol  [Meperidine Hcl] Hives  ? Magnesium Sulfate Anaphylaxis  ?  Per patient on 05/16/15, said her heart stopped when she received this medication.  ? Meperidine Hives  ? Albuterol Nausea And Vomiting  ? Gadolinium Derivatives Hives and Nausea And Vomiting  ? Other Other (See Comments)  ?  Macadamia nuts - SOB  ? Pirbuterol Other (See Comments)  ?  Headache ?  ? ? ?Review of Systems  ?Constitutional:  Negative for chills, fever and malaise/fatigue.  ?HENT: Negative.    ?Eyes:  Negative for blurred vision, double vision, photophobia, pain, discharge and redness.  ?     Dry eye  ?Respiratory:  Positive for cough. Negative for sputum production, shortness of breath and wheezing.   ?Cardiovascular:  Positive for chest pain. Negative for palpitations and leg swelling.  ?Gastrointestinal:  Negative for abdominal pain, blood in stool, constipation, diarrhea, nausea and vomiting.  ?Genitourinary: Negative.   ?Musculoskeletal: Negative.   ?Skin: Negative.   ?Neurological: Negative.   ?Psychiatric/Behavioral:  Negative for depression. The patient is not nervous/anxious.   ?All other systems reviewed  and are negative. ? ?   ?Objective:  ?  ?Physical Exam ?Constitutional:   ?   General: She is not in acute distress. ?   Appearance: Normal appearance. She is obese.  ?HENT:  ?   Head: Normocephalic.  ?   Right Ear: Tympanic membrane, ear canal and external ear normal. There is no impacted cerumen.  ?   Left Ear: Tympanic membrane, ear canal and external ear normal. There is no impacted cerumen.  ?   Nose: Nose normal. No congestion or rhinorrhea.  ?   Mouth/Throat:  ?   Mouth: Mucous membranes are moist.  ?   Pharynx: Oropharynx is clear. No oropharyngeal exudate or posterior oropharyngeal erythema.  ?Eyes:  ?   General: No scleral  icterus.    ?   Right eye: No discharge.     ?   Left eye: No discharge.  ?   Extraocular Movements: Extraocular movements intact.  ?   Conjunctiva/sclera: Conjunctivae normal.  ?   Pupils: Pupils are equal, round, and reactive to light.  ?Neck:  ?   Vascular: No carotid bruit.  ?Cardiovascular:  ?   Rate and Rhythm: Normal rate and regular rhythm.  ?   Pulses: Normal pulses.  ?   Heart sounds: Normal heart sounds.  ?   Comments: No obvious peripheral edema ?Pulmonary:  ?   Effort: Pulmonary effort is normal.  ?   Breath sounds: Normal breath sounds.  ?Musculoskeletal:  ?   Cervical back: Normal range of motion and neck supple. No rigidity or tenderness.  ?Lymphadenopathy:  ?   Cervical: No cervical adenopathy.  ?Skin: ?   General: Skin is warm and dry.  ?   Capillary Refill: Capillary refill takes less than 2 seconds.  ?Neurological:  ?   General: No focal deficit present.  ?   Mental Status: She is alert and oriented to person, place, and time.  ?Psychiatric:     ?   Mood and Affect: Mood normal.     ?   Behavior: Behavior normal.     ?   Thought Content: Thought content normal.     ?   Judgment: Judgment normal.  ? ? ?BP (!) 146/87 (BP Location: Left Arm, Patient Position: Sitting, Cuff Size: Large)   Pulse 88   Temp 98.4 ?F (36.9 ?C)   Ht 5\' 7"  (1.702 m)   Wt (!) 323 lb 3.2 oz  (146.6 kg)   SpO2 99%   BMI 50.62 kg/m?  ?Wt Readings from Last 3 Encounters:  ?12/04/21 (!) 323 lb 3.2 oz (146.6 kg)  ?11/15/21 (!) 317 lb 0.3 oz (143.8 kg)  ?05/23/21 (!) 315 lb (142.9 kg)  ? ? ?Immunization History  ?Ad

## 2021-12-05 ENCOUNTER — Encounter: Payer: Self-pay | Admitting: Nurse Practitioner

## 2021-12-05 ENCOUNTER — Other Ambulatory Visit: Payer: Self-pay | Admitting: Nurse Practitioner

## 2021-12-05 DIAGNOSIS — E119 Type 2 diabetes mellitus without complications: Secondary | ICD-10-CM

## 2021-12-05 LAB — COMPREHENSIVE METABOLIC PANEL
ALT: 15 IU/L (ref 0–32)
AST: 12 IU/L (ref 0–40)
Albumin/Globulin Ratio: 1.2 (ref 1.2–2.2)
Albumin: 3.8 g/dL (ref 3.8–4.8)
Alkaline Phosphatase: 115 IU/L (ref 44–121)
BUN/Creatinine Ratio: 20 (ref 9–23)
BUN: 13 mg/dL (ref 6–24)
Bilirubin Total: <0.2 mg/dL (ref 0.0–1.2)
CO2: 28 mmol/L (ref 20–29)
Calcium: 9.5 mg/dL (ref 8.7–10.2)
Chloride: 103 mmol/L (ref 96–106)
Creatinine, Ser: 0.65 mg/dL (ref 0.57–1.00)
Globulin, Total: 3.3 g/dL (ref 1.5–4.5)
Glucose: 126 mg/dL — ABNORMAL HIGH (ref 70–99)
Potassium: 3.6 mmol/L (ref 3.5–5.2)
Sodium: 144 mmol/L (ref 134–144)
Total Protein: 7.1 g/dL (ref 6.0–8.5)
eGFR: 109 mL/min/{1.73_m2} (ref >59)

## 2021-12-05 LAB — CBC WITH DIFFERENTIAL/PLATELET
Basophils Absolute: 0.1 10*3/uL (ref 0.0–0.2)
Basos: 1 %
EOS (ABSOLUTE): 0.1 10*3/uL (ref 0.0–0.4)
Eos: 1 %
Hematocrit: 38.4 % (ref 34.0–46.6)
Hemoglobin: 12.8 g/dL (ref 11.1–15.9)
Immature Grans (Abs): 0 10*3/uL (ref 0.0–0.1)
Immature Granulocytes: 0 %
Lymphocytes Absolute: 2.6 10*3/uL (ref 0.7–3.1)
Lymphs: 28 %
MCH: 29.4 pg (ref 26.6–33.0)
MCHC: 33.3 g/dL (ref 31.5–35.7)
MCV: 88 fL (ref 79–97)
Monocytes Absolute: 0.8 10*3/uL (ref 0.1–0.9)
Monocytes: 9 %
Neutrophils Absolute: 5.5 10*3/uL (ref 1.4–7.0)
Neutrophils: 61 %
Platelets: 390 10*3/uL (ref 150–450)
RBC: 4.35 x10E6/uL (ref 3.77–5.28)
RDW: 13.4 % (ref 11.7–15.4)
WBC: 9.1 10*3/uL (ref 3.4–10.8)

## 2021-12-05 LAB — LIPID PANEL
Chol/HDL Ratio: 3.7 ratio (ref 0.0–4.4)
Cholesterol, Total: 170 mg/dL (ref 100–199)
HDL: 46 mg/dL (ref >39)
LDL Chol Calc (NIH): 98 mg/dL (ref 0–99)
Triglycerides: 146 mg/dL (ref 0–149)
VLDL Cholesterol Cal: 26 mg/dL (ref 5–40)

## 2021-12-05 LAB — TSH+T4F+T3FREE
Free T4: 1.06 ng/dL (ref 0.82–1.77)
T3, Free: 3 pg/mL (ref 2.0–4.4)
TSH: 2.42 u[IU]/mL (ref 0.450–4.500)

## 2021-12-05 LAB — MAGNESIUM: Magnesium: 2 mg/dL (ref 1.6–2.3)

## 2021-12-05 MED ORDER — METFORMIN HCL 500 MG PO TABS: 500.0000 mg | ORAL_TABLET | Freq: Two times a day (BID) | ORAL | 3 refills | Status: DC

## 2022-01-17 ENCOUNTER — Encounter (HOSPITAL_BASED_OUTPATIENT_CLINIC_OR_DEPARTMENT_OTHER): Payer: Self-pay

## 2022-01-17 ENCOUNTER — Emergency Department (HOSPITAL_BASED_OUTPATIENT_CLINIC_OR_DEPARTMENT_OTHER)
Admission: EM | Admit: 2022-01-17 | Discharge: 2022-01-17 | Disposition: A | Discharge status: Home / Self Care | Payer: Self-pay | Attending: Emergency Medicine | Admitting: Emergency Medicine

## 2022-01-17 ENCOUNTER — Other Ambulatory Visit: Payer: Self-pay

## 2022-01-17 DIAGNOSIS — K0889 Other specified disorders of teeth and supporting structures: Secondary | ICD-10-CM | POA: Insufficient documentation

## 2022-01-17 DIAGNOSIS — Z7982 Long term (current) use of aspirin: Secondary | ICD-10-CM | POA: Insufficient documentation

## 2022-01-17 DIAGNOSIS — J029 Acute pharyngitis, unspecified: Secondary | ICD-10-CM | POA: Insufficient documentation

## 2022-01-17 MED ORDER — CLINDAMYCIN HCL 300 MG PO CAPS: 300.0000 mg | ORAL_CAPSULE | Freq: Three times a day (TID) | ORAL | 0 refills | Status: AC

## 2022-01-17 MED ORDER — KETOROLAC TROMETHAMINE 15 MG/ML IJ SOLN
15.0000 mg | Freq: Once | INTRAMUSCULAR | Status: AC
  Administered 2022-01-17: 15 mg via INTRAMUSCULAR
  Filled 2022-01-17: qty 1

## 2022-01-17 NOTE — Discharge Instructions (Signed)
Take 4 over the counter ibuprofen tablets 3 times a day or 2 over-the-counter naproxen tablets twice a day for pain. Also take tylenol 1000mg (2 extra strength) four times a day.   Take the antibiotics as prescribed.  Please follow-up with a dentist.

## 2022-01-17 NOTE — ED Triage Notes (Signed)
Sore throat, mouth and right eye pain for one week.

## 2022-01-17 NOTE — ED Provider Notes (Signed)
[facility] EMERGENCY DEPT Provider Note   CSN: [id number] Arrival date & time: 01/17/22  0441     History  Chief Complaint  Patient presents with   Sore Throat    Julia Ingram is a 46 y.o. female.  46 yo F with a cc of R upper dental pain.  Going on for about a week.  Feels like its going into her face and a sore throat.  No fevers. No trauma.    Sore Throat       Home Medications Prior to Admission medications   Medication Sig Start Date End Date Taking? Authorizing Provider  clindamycin (CLEOCIN) 300 MG capsule Take 1 capsule (300 mg total) by mouth 3 (three) times daily for 7 days. 01/17/22 01/24/22 Yes Melene Plan, DO  albuterol (PROVENTIL) (2.5 MG/3ML) 0.083% nebulizer solution Take 3 mLs (2.5 mg total) by nebulization every 6 (six) hours as needed for wheezing or shortness of breath. 11/17/21 11/17/22  Almon Hercules, MD  albuterol (VENTOLIN HFA) 108 (90 Base) MCG/ACT inhaler Inhale 2 puffs into the lungs every 6 (six) hours as needed for wheezing or shortness of breath. 06/08/19   Rodolph Bong, MD  amLODipine (NORVASC) 10 MG tablet Take 1 tablet (10 mg total) by mouth daily. 11/17/21   Almon Hercules, MD  aspirin-acetaminophen-caffeine (EXCEDRIN MIGRAINE) [phone] MG tablet Take 2 tablets by mouth every 6 (six) hours as needed for headache.     [provider]  azithromycin (ZITHROMAX Z-PAK) 250 MG tablet Take two tablets the first day, and one tablet daily for the next four days 12/04/21   Orion Crook I, NP  dextromethorphan-guaiFENesin (MUCINEX DM) 30-600 MG 12hr tablet Take 1 tablet by mouth 2 (two) times daily. 12/04/21   Passmore, Enid Derry I, NP  fluticasone (FLONASE) 50 MCG/ACT nasal spray Place 2 sprays into both nostrils daily. Patient not taking: Reported on 12/04/2021 06/08/19   Rodolph Bong, MD  losartan (COZAAR) 100 MG tablet Take 1 tablet (100 mg total) by mouth daily. 11/17/21   Almon Hercules, MD  metFORMIN (GLUCOPHAGE) 500 MG  tablet Take 1 tablet (500 mg total) by mouth 2 (two) times daily with a meal. 12/05/21   Passmore, Enid Derry I, NP  nicotine (NICODERM CQ - DOSED IN MG/24 HOURS) 14 mg/24hr patch Place 1 patch (14 mg total) onto the skin daily. 11/17/21   Almon Hercules, MD  Olopatadine HCl (PATADAY) 0.7 % SOLN Apply 1 drop to eye daily. 12/04/21   Passmore, Enid Derry I, NP  pantoprazole (PROTONIX) 40 MG tablet Take 1 tablet (40 mg total) by mouth daily at 6 (six) AM. Patient not taking: Reported on 12/04/2021 11/17/21   Almon Hercules, MD  Polyethyl Glycol-Propyl Glycol (SYSTANE) 0.4-0.3 % SOLN Apply 2 drops to eye 2 (two) times daily as needed (dry eye). 12/04/21   Passmore, Enid Derry I, NP  predniSONE (DELTASONE) 50 MG tablet Take 1 tablet (50 mg total) by mouth daily. 12/04/21   Orion Crook I, NP  SYMBICORT 160-4.5 MCG/ACT inhaler 2 puffs daily. 07/28/21   [provider]  umeclidinium bromide (INCRUSE ELLIPTA) 62.5 MCG/INH AEPB Inhale 1 puff into the lungs daily. Patient not taking: Reported on 12/04/2021 11/17/21   Almon Hercules, MD      Allergies    Demerol  [meperidine hcl], Magnesium sulfate, Meperidine, Albuterol, Gadolinium derivatives, Other, and Pirbuterol    Review of Systems   Review of Systems  Physical Exam Updated Vital Signs BP (!) 174/112  Pulse 84   Temp 98.6 F (37 C) (Oral)   Resp 18   Ht 5\' 9"  (1.753 m)   Wt (!) 163.3 kg   SpO2 96%   BMI 53.16 kg/m  Physical Exam Vitals and nursing note reviewed.  Constitutional:      General: She is not in acute distress.    Appearance: She is well-developed. She is not diaphoretic.  HENT:     Head: Normocephalic and atraumatic.     Mouth/Throat:      Comments: Right upper second molar fractured at the gumline along the medial aspect with some fullness to the area but no obvious fluctuance or erythema.  Uvula is midline.  Tolerating secretions without difficulty. Eyes:     Pupils: Pupils are equal, round, and reactive to light.  Cardiovascular:      Rate and Rhythm: Normal rate and regular rhythm.     Heart sounds: No murmur heard.    No friction rub. No gallop.  Pulmonary:     Effort: Pulmonary effort is normal.     Breath sounds: No wheezing or rales.  Abdominal:     General: There is no distension.     Palpations: Abdomen is soft.     Tenderness: There is no abdominal tenderness.  Musculoskeletal:        General: No tenderness.     Cervical back: Normal range of motion and neck supple.  Skin:    General: Skin is warm and dry.  Neurological:     Mental Status: She is alert and oriented to person, place, and time.  Psychiatric:        Behavior: Behavior normal.     ED Results / Procedures / Treatments   Labs (all labs ordered are listed, but only abnormal results are displayed) Labs Reviewed - No data to display  EKG None  Radiology No results found.  Procedures Procedures    Medications Ordered in ED Medications  ketorolac (TORADOL) 15 MG/ML injection 15 mg (has no administration in time range)    ED Course/ Medical Decision Making/ A&P                           Medical Decision Making Risk Prescription drug management.   [age] yoF with a chief complaints of right her dental pain.  Going on for about a week.  No obvious abscess on exam.  Will start on antibiotics.  We will have them follow-up with their dentist.  5:07 AM:  I have discussed the diagnosis/risks/treatment options with the patient.  Evaluation and diagnostic testing in the emergency department does not suggest an emergent condition requiring admission or immediate intervention beyond what has been performed at this time.  They will follow up with  Dentist. We also discussed returning to the ED immediately if new or worsening sx occur. We discussed the sx which are most concerning (e.g., sudden worsening pain, fever, inability to tolerate by mouth) that necessitate immediate return. Medications administered to the patient during their visit and  any new prescriptions provided to the patient are listed below.  Medications given during this visit Medications  ketorolac (TORADOL) 15 MG/ML injection 15 mg (has no administration in time range)     The patient appears reasonably screen and/or stabilized for discharge and I doubt any other medical condition or other [facility] requiring further screening, evaluation, or treatment in the ED at this time prior to discharge.  Final Clinical Impression(s) / ED Diagnoses Final diagnoses:  Pain, dental    Rx / DC Orders ED Discharge Orders          Ordered    clindamycin (CLEOCIN) 300 MG capsule  3 times daily        01/17/22 0504              Melene Plan, DO 01/17/22 0507

## 2022-01-17 NOTE — ED Notes (Signed)
Patient verbalizes understanding of discharge instructions. Opportunity for questioning and answers were provided. Armband removed by staff, pt discharged from ED. Ambulated out to lobby  

## 2022-01-31 ENCOUNTER — Encounter: Payer: Self-pay | Admitting: Nurse Practitioner

## 2022-01-31 ENCOUNTER — Ambulatory Visit (INDEPENDENT_AMBULATORY_CARE_PROVIDER_SITE_OTHER): Payer: Medicaid - Out of State | Admitting: Nurse Practitioner

## 2022-01-31 VITALS — BP 144/82 | HR 76 | Temp 98.4°F | Ht 69.0 in | Wt 320.1 lb

## 2022-01-31 DIAGNOSIS — R39198 Other difficulties with micturition: Secondary | ICD-10-CM

## 2022-01-31 DIAGNOSIS — R197 Diarrhea, unspecified: Secondary | ICD-10-CM

## 2022-01-31 DIAGNOSIS — H04123 Dry eye syndrome of bilateral lacrimal glands: Secondary | ICD-10-CM

## 2022-01-31 DIAGNOSIS — E119 Type 2 diabetes mellitus without complications: Secondary | ICD-10-CM | POA: Insufficient documentation

## 2022-01-31 LAB — POCT GLYCOSYLATED HEMOGLOBIN (HGB A1C)
HbA1c POC (<> result, manual entry): 6.7 % (ref 4.0–5.6)
HbA1c, POC (controlled diabetic range): 6.7 % (ref 0.0–7.0)
HbA1c, POC (prediabetic range): 6.7 % — AB (ref 5.7–6.4)
Hemoglobin A1C: 6.7 % — AB (ref 4.0–5.6)

## 2022-01-31 LAB — POCT URINALYSIS DIP (CLINITEK)
Bilirubin, UA: NEGATIVE
Blood, UA: NEGATIVE
Glucose, UA: NEGATIVE mg/dL
Ketones, POC UA: NEGATIVE mg/dL
Leukocytes, UA: NEGATIVE
Nitrite, UA: NEGATIVE
POC PROTEIN,UA: 30 — AB
Spec Grav, UA: 1.025 (ref 1.010–1.025)
Urobilinogen, UA: 1 E.U./dL
pH, UA: 6 (ref 5.0–8.0)

## 2022-01-31 MED ORDER — LOPERAMIDE HCL 2 MG PO TABS: 2.0000 mg | ORAL_TABLET | Freq: Four times a day (QID) | ORAL | 0 refills | Status: DC | PRN

## 2022-01-31 NOTE — Addendum Note (Signed)
Addended by: Dennison Mascot on: 01/31/2022 03:39 PM   Modules accepted: Orders

## 2022-01-31 NOTE — Patient Instructions (Signed)
1. Type 2 diabetes mellitus without complication, without long-term current use of insulin (HCC)  - POCT glycosylated hemoglobin (Hb A1C) - Ambulatory referral to Gastroenterology - CBC - Comprehensive metabolic panel  2. Dry eyes  - Ambulatory referral to Optometry  3. Difficulty urinating  - Ambulatory referral to Urology  4. Diarrhea, unspecified type  - loperamide (IMODIUM A-D) 2 MG tablet; Take 1 tablet (2 mg total) by mouth 4 (four) times daily as needed for diarrhea or loose stools.  Dispense: 30 tablet; Refill: 0 - Ambulatory referral to Gastroenterology  Follow up:  Follow up in 3 months

## 2022-01-31 NOTE — Progress Notes (Signed)
@Patient  ID: , female    DOB: Sep 20, 1975, 46 y.o.   MRN: [id number]  Chief Complaint  Patient presents with   Follow-up    2 months FU. Pt is complaining of frequent urination. Pt complains of bowels being the consistency of apple sauce for 3 weeks    Referring provider: No ref. provider found   HPI  Julia Ingram 46 y.o. female  has a past medical history of Asthma, CAD (coronary artery disease) (06/06/2019), Hypertension, MI (myocardial infarction) (HCC), Pyloric stenosis, and Vocal cord paralysis (06/06/2019).  Patient presents today for a follow up visit. She was last seen by [facility] for cough and dry eyes. She states that cough is improved. Dry eyes are still an issue. She has ahd diarrhea x 3 weeks.  When asked about where she is taking medications she stated " what part of me saying that I am not taking my medications do not understand?"  We discussed that this is not an appropriate answer.  Patient states that she is having trouble paying for medications because she does not have insurance although Medicaid is listed in the chart.  We will have our social worker reach out to her. Denies f/c/s, n/v/d, hemoptysis, PND, leg swelling Denies chest pain or edema    Allergies  Allergen Reactions   Demerol  [Meperidine Hcl] Hives   Magnesium Sulfate Anaphylaxis    Per patient on 05/16/15, said her heart stopped when she received this medication.   Meperidine Hives   Albuterol Nausea And Vomiting   Gadolinium Derivatives Hives and Nausea And Vomiting   Other Other (See Comments)    Macadamia nuts - SOB   Pirbuterol Other (See Comments)    Headache     Immunization History  Administered Date(s) Administered   Influenza Whole 11/04/2015   Influenza,inj,Quad PF,6+ Mos 09/18/2016   Pneumococcal Polysaccharide-23 11/04/2015   Td 11/26/2013    Past Medical History:  Diagnosis Date   Asthma    CAD (coronary artery disease) 06/06/2019   Hypertension    MI (myocardial  infarction) (HCC)    Pyloric stenosis    Vocal cord paralysis 06/06/2019    Tobacco History: Social History   Tobacco Use  Smoking Status Every Day   Packs/day: 0.50   Years: 20.00   Total pack years: 10.00   Types: Cigarettes  Smokeless Tobacco Never   Ready to quit: Not Answered Counseling given: Not Answered   Outpatient Encounter Medications as of 01/31/2022  Medication Sig   loperamide (IMODIUM A-D) 2 MG tablet Take 1 tablet (2 mg total) by mouth 4 (four) times daily as needed for diarrhea or loose stools.   albuterol (PROVENTIL) (2.5 MG/3ML) 0.083% nebulizer solution Take 3 mLs (2.5 mg total) by nebulization every 6 (six) hours as needed for wheezing or shortness of breath. (Patient not taking: Reported on 01/31/2022)   albuterol (VENTOLIN HFA) 108 (90 Base) MCG/ACT inhaler Inhale 2 puffs into the lungs every 6 (six) hours as needed for wheezing or shortness of breath. (Patient not taking: Reported on 01/31/2022)   amLODipine (NORVASC) 10 MG tablet Take 1 tablet (10 mg total) by mouth daily. (Patient not taking: Reported on 01/31/2022)   aspirin-acetaminophen-caffeine (EXCEDRIN MIGRAINE) 250-250-65 MG tablet Take 2 tablets by mouth every 6 (six) hours as needed for headache.  (Patient not taking: Reported on 01/31/2022)   azithromycin (ZITHROMAX Z-PAK) 250 MG tablet Take two tablets the first day, and one tablet daily for the next four days (Patient not taking: Reported  on 01/31/2022)   dextromethorphan-guaiFENesin (MUCINEX DM) 30-600 MG 12hr tablet Take 1 tablet by mouth 2 (two) times daily. (Patient not taking: Reported on 01/31/2022)   fluticasone (FLONASE) 50 MCG/ACT nasal spray Place 2 sprays into both nostrils daily. (Patient not taking: Reported on 12/04/2021)   losartan (COZAAR) 100 MG tablet Take 1 tablet (100 mg total) by mouth daily. (Patient not taking: Reported on 01/31/2022)   metFORMIN (GLUCOPHAGE) 500 MG tablet Take 1 tablet (500 mg total) by mouth 2 (two) times daily with  a meal. (Patient not taking: Reported on 01/31/2022)   nicotine (NICODERM CQ - DOSED IN MG/24 HOURS) 14 mg/24hr patch Place 1 patch (14 mg total) onto the skin daily. (Patient not taking: Reported on 01/31/2022)   Olopatadine HCl (PATADAY) 0.7 % SOLN Apply 1 drop to eye daily. (Patient not taking: Reported on 01/31/2022)   pantoprazole (PROTONIX) 40 MG tablet Take 1 tablet (40 mg total) by mouth daily at 6 (six) AM. (Patient not taking: Reported on 12/04/2021)   Polyethyl Glycol-Propyl Glycol (SYSTANE) 0.4-0.3 % SOLN Apply 2 drops to eye 2 (two) times daily as needed (dry eye). (Patient not taking: Reported on 01/31/2022)   predniSONE (DELTASONE) 50 MG tablet Take 1 tablet (50 mg total) by mouth daily. (Patient not taking: Reported on 01/31/2022)   SYMBICORT 160-4.5 MCG/ACT inhaler 2 puffs daily. (Patient not taking: Reported on 01/31/2022)   umeclidinium bromide (INCRUSE ELLIPTA) 62.5 MCG/INH AEPB Inhale 1 puff into the lungs daily. (Patient not taking: Reported on 12/04/2021)   No facility-administered encounter medications on file as of 01/31/2022.     Review of Systems  Review of Systems  Constitutional: Negative.   HENT: Negative.    Cardiovascular: Negative.   Gastrointestinal:  Positive for diarrhea.  Allergic/Immunologic: Negative.   Neurological: Negative.   Psychiatric/Behavioral: Negative.         Physical Exam  BP (!) 144/82 (BP Location: Right Arm, Patient Position: Sitting, Cuff Size: Large)   Pulse 76   Temp 98.4 F (36.9 C)   Ht 5\' 9"  (1.753 m)   Wt (!) 320 lb 1 oz (145.2 kg)   SpO2 97%   BMI 47.26 kg/m   Wt Readings from Last 5 Encounters:  01/31/22 (!) 320 lb 1 oz (145.2 kg)  01/17/22 (!) 360 lb (163.3 kg)  12/04/21 (!) 323 lb 3.2 oz (146.6 kg)  11/15/21 (!) 317 lb 0.3 oz (143.8 kg)  05/23/21 (!) 315 lb (142.9 kg)     Physical Exam Vitals and nursing note reviewed.  Constitutional:      General: She is not in acute distress.    Appearance: She is  well-developed.  Cardiovascular:     Rate and Rhythm: Normal rate and regular rhythm.  Pulmonary:     Effort: Pulmonary effort is normal.     Breath sounds: Normal breath sounds.  Neurological:     Mental Status: She is alert and oriented to person, place, and time.      Lab Results:  CBC    Component Value Date/Time   WBC 9.1 12/04/2021 1521   WBC 8.9 11/16/2021 0404   RBC 4.35 12/04/2021 1521   RBC 4.51 11/16/2021 0404   HGB 12.8 12/04/2021 1521   HCT 38.4 12/04/2021 1521   PLT 390 12/04/2021 1521   MCV 88 12/04/2021 1521   MCH 29.4 12/04/2021 1521   MCH 30.2 11/16/2021 0404   MCHC 33.3 12/04/2021 1521   MCHC 32.9 11/16/2021 0404   RDW 13.4 12/04/2021 1521  LYMPHSABS 2.6 12/04/2021 1521   MONOABS 1.0 11/15/2021 1523   EOSABS 0.1 12/04/2021 1521   BASOSABS 0.1 12/04/2021 1521    BMET    Component Value Date/Time   NA 144 12/04/2021 1521   K 3.6 12/04/2021 1521   CL 103 12/04/2021 1521   CO2 28 12/04/2021 1521   GLUCOSE 126 (H) 12/04/2021 1521   GLUCOSE 235 (H) 11/17/2021 0356   BUN 13 12/04/2021 1521   CREATININE 0.65 12/04/2021 1521   CALCIUM 9.5 12/04/2021 1521   GFRNONAA >60 11/17/2021 0356   GFRAA >60 06/08/2019 0845    BNP    Component Value Date/Time   BNP 88.6 06/06/2019 0231    ProBNP No results found for: "PROBNP"  Imaging: No results found.   Assessment & Plan:   Type 2 diabetes mellitus without complication, without long-term current use of insulin (HCC) - POCT glycosylated hemoglobin (Hb A1C) - Ambulatory referral to Gastroenterology - CBC - Comprehensive metabolic panel  2. Dry eyes  - Ambulatory referral to Optometry  3. Difficulty urinating  - Ambulatory referral to Urology  4. Diarrhea, unspecified type  - loperamide (IMODIUM A-D) 2 MG tablet; Take 1 tablet (2 mg total) by mouth 4 (four) times daily as needed for diarrhea or loose stools.  Dispense: 30 tablet; Refill: 0 - Ambulatory referral to  Gastroenterology  Follow up:  Follow up in 3 months     Ivonne Andrew, NP 01/31/2022

## 2022-01-31 NOTE — Assessment & Plan Note (Signed)
-   POCT glycosylated hemoglobin (Hb A1C) - Ambulatory referral to Gastroenterology - CBC - Comprehensive metabolic panel  2. Dry eyes  - Ambulatory referral to Optometry  3. Difficulty urinating  - Ambulatory referral to Urology  4. Diarrhea, unspecified type  - loperamide (IMODIUM A-D) 2 MG tablet; Take 1 tablet (2 mg total) by mouth 4 (four) times daily as needed for diarrhea or loose stools.  Dispense: 30 tablet; Refill: 0 - Ambulatory referral to Gastroenterology  Follow up:  Follow up in 3 months

## 2022-02-01 LAB — CBC
Hematocrit: 37.4 % (ref 34.0–46.6)
Hemoglobin: 12.7 g/dL (ref 11.1–15.9)
MCH: 30 pg (ref 26.6–33.0)
MCHC: 34 g/dL (ref 31.5–35.7)
MCV: 88 fL (ref 79–97)
Platelets: 421 10*3/uL (ref 150–450)
RBC: 4.24 x10E6/uL (ref 3.77–5.28)
RDW: 13.5 % (ref 11.7–15.4)
WBC: 8.9 10*3/uL (ref 3.4–10.8)

## 2022-02-01 LAB — COMPREHENSIVE METABOLIC PANEL
ALT: 10 IU/L (ref 0–32)
AST: 9 IU/L (ref 0–40)
Albumin/Globulin Ratio: 1.3 (ref 1.2–2.2)
Albumin: 3.9 g/dL (ref 3.8–4.8)
Alkaline Phosphatase: 114 IU/L (ref 44–121)
BUN/Creatinine Ratio: 16 (ref 9–23)
BUN: 10 mg/dL (ref 6–24)
Bilirubin Total: 0.2 mg/dL (ref 0.0–1.2)
CO2: 28 mmol/L (ref 20–29)
Calcium: 9.4 mg/dL (ref 8.7–10.2)
Chloride: 103 mmol/L (ref 96–106)
Creatinine, Ser: 0.61 mg/dL (ref 0.57–1.00)
Globulin, Total: 3.1 g/dL (ref 1.5–4.5)
Glucose: 123 mg/dL — ABNORMAL HIGH (ref 70–99)
Potassium: 3.3 mmol/L — ABNORMAL LOW (ref 3.5–5.2)
Sodium: 143 mmol/L (ref 134–144)
Total Protein: 7 g/dL (ref 6.0–8.5)
eGFR: 110 mL/min/{1.73_m2} (ref >59)

## 2022-02-05 ENCOUNTER — Ambulatory Visit: Payer: Medicaid - Out of State | Admitting: Nurse Practitioner

## 2022-02-14 ENCOUNTER — Ambulatory Visit: Payer: Self-pay | Admitting: Clinical

## 2022-02-14 DIAGNOSIS — Z5989 Other problems related to housing and economic circumstances: Secondary | ICD-10-CM

## 2022-02-14 NOTE — Progress Notes (Unsigned)
Integrated Behavioral Health Case Management Referral Note  02/14/2022 Name: Julia Ingram MRN: [id number] DOB: [birth year] Julia Ingram is a 46 y.o. year old female who sees Fenton Foy, NP for primary care. LCSW was consulted to assess patient's needs and assist the patient with financial difficulties related to lack of health coverage and low income.  Interpreter: No.   Interpreter Name & Language: none  Assessment: Patient experiencing inancial difficulties related to lack of health coverage and low income.  Intervention: Patient does not currently have insurance and cannot afford her medications. CSW and patient met at the Patient [facility]). Completed [employer], [employer], and [employer]. CSW submitted [employer] application. Patient to provide additional supporting documents for [facility] and CSW will follow to assist in submitting these.  SDOH (Social Determinants of Health) assessments performed: Yes   Review of patient status, including review of consultants reports, relevant laboratory and other test results, and collaboration with appropriate care team members and the patient's provider was performed as part of comprehensive patient evaluation and provision of services.    Estanislado Emms, [facility] [phone]

## 2022-02-19 ENCOUNTER — Telehealth: Payer: Self-pay | Admitting: Clinical

## 2022-02-19 NOTE — Telephone Encounter (Signed)
Integrated Behavioral Health General Follow Up Note  02/19/2022 Name: Julia Ingram MRN: [id number] DOB: 04-30-1976 Julia Ingram is a 46 y.o. year old female who sees Ivonne Andrew, NP for primary care. LCSW was initially consulted to assist the patient with financial difficulties related to lack of health coverage and low income.  Interpreter: No.   Interpreter Name & Language: none  Assessment: Patient experiencing financial difficulties related to lack of health coverage and low income..  Ongoing Intervention: Today patient provided additional documentation needed for [employer] and [employer] applications. CSW submitted these applications for patient. CSW to follow.  SDOH (Social Determinants of Health) assessments performed: No  Review of patient status, including review of consultants reports, relevant laboratory and other test results, and collaboration with appropriate care team members and the patient's provider was performed as part of comprehensive patient evaluation and provision of services.    Abigail Butts, LCSW [facility] [phone]

## 2022-05-03 ENCOUNTER — Ambulatory Visit: Payer: Self-pay | Admitting: Nurse Practitioner

## 2022-09-27 ENCOUNTER — Emergency Department (HOSPITAL_COMMUNITY)
Admission: EM | Admit: 2022-09-27 | Discharge: 2022-09-27 | Disposition: A | Discharge status: Home / Self Care | Payer: Self-pay | Attending: Emergency Medicine | Admitting: Emergency Medicine

## 2022-09-27 ENCOUNTER — Emergency Department (HOSPITAL_COMMUNITY): Payer: Self-pay

## 2022-09-27 ENCOUNTER — Encounter (HOSPITAL_COMMUNITY): Payer: Self-pay

## 2022-09-27 DIAGNOSIS — D72829 Elevated white blood cell count, unspecified: Secondary | ICD-10-CM | POA: Insufficient documentation

## 2022-09-27 DIAGNOSIS — F17210 Nicotine dependence, cigarettes, uncomplicated: Secondary | ICD-10-CM | POA: Insufficient documentation

## 2022-09-27 DIAGNOSIS — I10 Essential (primary) hypertension: Secondary | ICD-10-CM | POA: Insufficient documentation

## 2022-09-27 DIAGNOSIS — E1165 Type 2 diabetes mellitus with hyperglycemia: Secondary | ICD-10-CM | POA: Insufficient documentation

## 2022-09-27 DIAGNOSIS — I2510 Atherosclerotic heart disease of native coronary artery without angina pectoris: Secondary | ICD-10-CM | POA: Insufficient documentation

## 2022-09-27 DIAGNOSIS — Z7982 Long term (current) use of aspirin: Secondary | ICD-10-CM | POA: Insufficient documentation

## 2022-09-27 DIAGNOSIS — Z7984 Long term (current) use of oral hypoglycemic drugs: Secondary | ICD-10-CM | POA: Insufficient documentation

## 2022-09-27 DIAGNOSIS — E876 Hypokalemia: Secondary | ICD-10-CM | POA: Insufficient documentation

## 2022-09-27 DIAGNOSIS — Z79899 Other long term (current) drug therapy: Secondary | ICD-10-CM | POA: Insufficient documentation

## 2022-09-27 DIAGNOSIS — I471 Supraventricular tachycardia, unspecified: Secondary | ICD-10-CM | POA: Insufficient documentation

## 2022-09-27 DIAGNOSIS — Z7951 Long term (current) use of inhaled steroids: Secondary | ICD-10-CM | POA: Insufficient documentation

## 2022-09-27 DIAGNOSIS — J45909 Unspecified asthma, uncomplicated: Secondary | ICD-10-CM | POA: Insufficient documentation

## 2022-09-27 LAB — COMPREHENSIVE METABOLIC PANEL
ALT: 13 U/L (ref 0–44)
AST: 17 U/L (ref 15–41)
Albumin: 3.1 g/dL — ABNORMAL LOW (ref 3.5–5.0)
Alkaline Phosphatase: 102 U/L (ref 38–126)
Anion gap: 8 (ref 5–15)
BUN: 12 mg/dL (ref 6–20)
CO2: 29 mmol/L (ref 22–32)
Calcium: 8.9 mg/dL (ref 8.9–10.3)
Chloride: 103 mmol/L (ref 98–111)
Creatinine, Ser: 0.75 mg/dL (ref 0.44–1.00)
GFR, Estimated: >60 mL/min (ref >60)
Glucose, Bld: 171 mg/dL — ABNORMAL HIGH (ref 70–99)
Potassium: 2.9 mmol/L — ABNORMAL LOW (ref 3.5–5.1)
Sodium: 140 mmol/L (ref 135–145)
Total Bilirubin: 0.5 mg/dL (ref 0.3–1.2)
Total Protein: 7.2 g/dL (ref 6.5–8.1)

## 2022-09-27 LAB — CBC
HCT: 40.1 % (ref 36.0–46.0)
Hemoglobin: 13 g/dL (ref 12.0–15.0)
MCH: 29.2 pg (ref 26.0–34.0)
MCHC: 32.4 g/dL (ref 30.0–36.0)
MCV: 90.1 fL (ref 80.0–100.0)
Platelets: 421 10*3/uL — ABNORMAL HIGH (ref 150–400)
RBC: 4.45 MIL/uL (ref 3.87–5.11)
RDW: 13.3 % (ref 11.5–15.5)
WBC: 12.7 10*3/uL — ABNORMAL HIGH (ref 4.0–10.5)
nRBC: 0 % (ref 0.0–0.2)

## 2022-09-27 LAB — I-STAT CHEM 8, ED
BUN: 13 mg/dL (ref 6–20)
Calcium, Ion: 1.14 mmol/L — ABNORMAL LOW (ref 1.15–1.40)
Chloride: 101 mmol/L (ref 98–111)
Creatinine, Ser: 0.7 mg/dL (ref 0.44–1.00)
Glucose, Bld: 163 mg/dL — ABNORMAL HIGH (ref 70–99)
HCT: 40 % (ref 36.0–46.0)
Hemoglobin: 13.6 g/dL (ref 12.0–15.0)
Potassium: 2.9 mmol/L — ABNORMAL LOW (ref 3.5–5.1)
Sodium: 143 mmol/L (ref 135–145)
TCO2: 28 mmol/L (ref 22–32)

## 2022-09-27 LAB — TROPONIN I (HIGH SENSITIVITY)
Troponin I (High Sensitivity): 16 ng/L (ref <18)
Troponin I (High Sensitivity): 40 ng/L — ABNORMAL HIGH (ref <18)

## 2022-09-27 LAB — I-STAT BETA HCG BLOOD, ED (MC, WL, AP ONLY): I-stat hCG, quantitative: <5 m[IU]/mL (ref <5)

## 2022-09-27 LAB — BRAIN NATRIURETIC PEPTIDE: B Natriuretic Peptide: 28 pg/mL (ref 0.0–100.0)

## 2022-09-27 LAB — T4, FREE: Free T4: 0.76 ng/dL (ref 0.61–1.12)

## 2022-09-27 LAB — TSH: TSH: 2.545 u[IU]/mL (ref 0.350–4.500)

## 2022-09-27 MED ORDER — SODIUM CHLORIDE 0.9 % IV SOLN: INTRAVENOUS | Status: DC

## 2022-09-27 MED ORDER — METOPROLOL TARTRATE 5 MG/5ML IV SOLN
5.0000 mg | Freq: Once | INTRAVENOUS | Status: AC
  Administered 2022-09-27: 5 mg via INTRAVENOUS
  Filled 2022-09-27: qty 5

## 2022-09-27 MED ORDER — IOHEXOL 350 MG/ML SOLN
75.0000 mL | Freq: Once | INTRAVENOUS | Status: AC | PRN
  Administered 2022-09-27: 75 mL via INTRAVENOUS

## 2022-09-27 MED ORDER — NITROGLYCERIN 2 % TD OINT
1.0000 [in_us] | TOPICAL_OINTMENT | Freq: Once | TRANSDERMAL | Status: AC
  Administered 2022-09-27: 1 [in_us] via TOPICAL
  Filled 2022-09-27: qty 1

## 2022-09-27 MED ORDER — METOPROLOL SUCCINATE ER 25 MG PO TB24: 25.0000 mg | ORAL_TABLET | Freq: Every day | ORAL | 0 refills | Status: AC

## 2022-09-27 MED ORDER — SODIUM CHLORIDE 0.9 % IV BOLUS
1000.0000 mL | Freq: Once | INTRAVENOUS | Status: AC
  Administered 2022-09-27: 1000 mL via INTRAVENOUS

## 2022-09-27 MED ORDER — POTASSIUM CHLORIDE 10 MEQ/100ML IV SOLN
10.0000 meq | Freq: Once | INTRAVENOUS | Status: AC
  Administered 2022-09-27: 10 meq via INTRAVENOUS
  Filled 2022-09-27: qty 100

## 2022-09-27 NOTE — ED Notes (Signed)
Reported Trop of 40 to MD.

## 2022-09-27 NOTE — ED Triage Notes (Signed)
Pt presents to ED for evaluation of 2 days of chest pain and sob. With hx of same.

## 2022-09-27 NOTE — ED Provider Notes (Signed)
[facility] Provider Note  CSN: [id number] Arrival date & time: 09/27/22 0124  Chief Complaint(s) Chest Pain  HPI Julia [place] is a 47 y.o. female -with a past medical history listed below including hypertension, hyperlipidemia, prior MIs and CAD who presents to the emergency department with sudden onset of chest pain and tachycardia that began 2 hours prior to arrival.  Pain started at rest.  Its pressure-like, nonradiating.  Associated with shortness of breath.  Patient denies any recent fevers or infections.  No coughing or congestion.  No nausea or vomiting.  No abdominal pain.  She denies any illicit drug use.  States that she is not complaint with some of her home medications including her BP meds.  The history is provided by the patient.    Past Medical History Past Medical History:  Diagnosis Date   Asthma    CAD (coronary artery disease) 06/06/2019   Hypertension    MI (myocardial infarction) ([place])    Pyloric stenosis    Vocal cord paralysis 06/06/2019   Patient Active Problem List   Diagnosis Date Noted   Type 2 diabetes mellitus without complication, without long-term current use of insulin ([place]) 01/31/2022   Hyperglycemia 11/16/2021   Rhinovirus infection 11/16/2021   Asthma exacerbation 11/15/2021   Vocal cord paralysis 06/06/2019   Tobacco abuse 06/06/2019   CAD (coronary artery disease) 06/06/2019   Acute bronchitis 06/05/2019   Acute respiratory failure ([place]) 06/05/2019   Acute respiratory distress 06/05/2019   Acute asthma exacerbation 06/05/2019   Solitary pulmonary nodule 10/16/2016   Dyspnea on exertion 10/16/2016   Upper airway cough syndrome 09/18/2016   Hypertensive urgency 09/18/2016   Morbid (severe) obesity due to excess calories ([place]) 09/18/2016   Cigarette smoker 09/18/2016   Home Medication(s) Prior to Admission medications   Medication Sig Start Date End Date Taking? Authorizing Provider   metoprolol succinate (TOPROL-XL) 25 MG 24 hr tablet Take 1 tablet (25 mg total) by mouth daily. 09/27/22 10/27/22 Yes Abdiel Blackerby, Grayce Sessions, MD  albuterol (PROVENTIL) (2.5 MG/3ML) 0.083% nebulizer solution Take 3 mLs (2.5 mg total) by nebulization every 6 (six) hours as needed for wheezing or shortness of breath. Patient not taking: Reported on 01/31/2022 11/17/21 11/17/22  Mercy Riding, MD  albuterol (VENTOLIN HFA) 108 (90 Base) MCG/ACT inhaler Inhale 2 puffs into the lungs every 6 (six) hours as needed for wheezing or shortness of breath. Patient not taking: Reported on 01/31/2022 06/08/19   Eugenie Filler, MD  amLODipine (NORVASC) 10 MG tablet Take 1 tablet (10 mg total) by mouth daily. Patient not taking: Reported on 01/31/2022 11/17/21   Mercy Riding, MD  aspirin-acetaminophen-caffeine (EXCEDRIN MIGRAINE) [phone] MG tablet Take 2 tablets by mouth every 6 (six) hours as needed for headache.  Patient not taking: Reported on 01/31/2022    [provider]  azithromycin (ZITHROMAX Z-PAK) 250 MG tablet Take two tablets the first day, and one tablet daily for the next four days Patient not taking: Reported on 01/31/2022 12/04/21   Bo Merino I, NP  dextromethorphan-guaiFENesin (MUCINEX DM) 30-600 MG 12hr tablet Take 1 tablet by mouth 2 (two) times daily. Patient not taking: Reported on 01/31/2022 12/04/21   Bo Merino I, NP  fluticasone (FLONASE) 50 MCG/ACT nasal spray Place 2 sprays into both nostrils daily. Patient not taking: Reported on 12/04/2021 06/08/19   Eugenie Filler, MD  loperamide (IMODIUM A-D) 2 MG tablet Take 1 tablet (2 mg total) by mouth 4 (  four) times daily as needed for diarrhea or loose stools. 01/31/22   Fenton Foy, NP  losartan (COZAAR) 100 MG tablet Take 1 tablet (100 mg total) by mouth daily. Patient not taking: Reported on 01/31/2022 11/17/21   Mercy Riding, MD  metFORMIN (GLUCOPHAGE) 500 MG tablet Take 1 tablet (500 mg total) by mouth 2 (two) times daily  with a meal. Patient not taking: Reported on 01/31/2022 12/05/21   Bo Merino I, NP  nicotine (NICODERM CQ - DOSED IN MG/24 HOURS) 14 mg/24hr patch Place 1 patch (14 mg total) onto the skin daily. Patient not taking: Reported on 01/31/2022 11/17/21   Mercy Riding, MD  Olopatadine HCl (PATADAY) 0.7 % SOLN Apply 1 drop to eye daily. Patient not taking: Reported on 01/31/2022 12/04/21   Bo Merino I, NP  pantoprazole (PROTONIX) 40 MG tablet Take 1 tablet (40 mg total) by mouth daily at 6 (six) AM. Patient not taking: Reported on 12/04/2021 11/17/21   Mercy Riding, MD  Polyethyl Glycol-Propyl Glycol (SYSTANE) 0.4-0.3 % SOLN Apply 2 drops to eye 2 (two) times daily as needed (dry eye). Patient not taking: Reported on 01/31/2022 12/04/21   Bo Merino I, NP  predniSONE (DELTASONE) 50 MG tablet Take 1 tablet (50 mg total) by mouth daily. Patient not taking: Reported on 01/31/2022 12/04/21   Bo Merino I, NP  SYMBICORT 160-4.5 MCG/ACT inhaler 2 puffs daily. Patient not taking: Reported on 01/31/2022 07/28/21   [provider]  umeclidinium bromide (INCRUSE ELLIPTA) 62.5 MCG/INH AEPB Inhale 1 puff into the lungs daily. Patient not taking: Reported on 12/04/2021 11/17/21   Mercy Riding, MD                                                                                                                                    Allergies Demerol  [meperidine hcl], Magnesium sulfate, Meperidine, Albuterol, Gadolinium derivatives, Other, and Pirbuterol  Review of Systems Review of Systems As noted in HPI  Physical Exam Vital Signs  I have reviewed the triage vital signs BP 125/86   Pulse 85   Temp 97.7 F (36.5 C) (Oral)   Resp (!) 24   Ht 5' 9"$  (1.753 m)   Wt (!) 138.8 kg   SpO2 90%   BMI 45.19 kg/m   Physical Exam Vitals reviewed.  Constitutional:      General: She is not in acute distress.    Appearance: She is well-developed. She is morbidly obese. She is not diaphoretic.   HENT:     Head: Normocephalic and atraumatic.     Nose: Nose normal.  Eyes:     General: No scleral icterus.       Right eye: No discharge.        Left eye: No discharge.     Conjunctiva/sclera: Conjunctivae normal.     Pupils: Pupils are equal, round, and reactive to light.  Cardiovascular:  Rate and Rhythm: Regular rhythm. Tachycardia present.     Heart sounds: No murmur heard.    No friction rub. No gallop.  Pulmonary:     Effort: Pulmonary effort is normal. No respiratory distress.     Breath sounds: Normal breath sounds. No stridor. No rales.  Abdominal:     General: There is no distension.     Palpations: Abdomen is soft.     Tenderness: There is no abdominal tenderness.  Musculoskeletal:        General: No tenderness.     Cervical back: Normal range of motion and neck supple.  Skin:    General: Skin is warm and dry.     Findings: No erythema or rash.  Neurological:     Mental Status: She is alert and oriented to person, place, and time.     ED Results and Treatments Labs (all labs ordered are listed, but only abnormal results are displayed) Labs Reviewed  CBC - Abnormal; Notable for the following components:      Result Value   WBC 12.7 (*)    Platelets 421 (*)    All other components within normal limits  COMPREHENSIVE METABOLIC PANEL - Abnormal; Notable for the following components:   Potassium 2.9 (*)    Glucose, Bld 171 (*)    Albumin 3.1 (*)    All other components within normal limits  I-STAT CHEM 8, ED - Abnormal; Notable for the following components:   Potassium 2.9 (*)    Glucose, Bld 163 (*)    Calcium, Ion 1.14 (*)    All other components within normal limits  TROPONIN I (HIGH SENSITIVITY) - Abnormal; Notable for the following components:   Troponin I (High Sensitivity) 40 (*)    All other components within normal limits  BRAIN NATRIURETIC PEPTIDE  TSH  T4, FREE  RAPID URINE DRUG SCREEN, HOSP PERFORMED  I-STAT BETA HCG BLOOD, ED ([facility], [facility],  [facility] ONLY)  CBG MONITORING, ED  TROPONIN I (HIGH SENSITIVITY)                                                                                                                         EKG  EKG Interpretation  Date/Time:  Thursday September 27 2022 01:31:15 EST Ventricular Rate:  145 PR Interval:  99 QRS Duration: 90 QT Interval:  352 QTC Calculation: 547 R Axis:   -74 Text Interpretation: Sinus tachycardia Left anterior fascicular block Abnormal R-wave progression, late transition LVH with secondary repolarization abnormality Prolonged QT interval Confirmed by Addison Lank [phone]) on 09/27/2022 1:39:21 AM        EKG Interpretation  Date/Time:  Thursday September 27 2022 03:56:34 EST Ventricular Rate:  80 PR Interval:  176 QRS Duration: 98 QT Interval:  416 QTC Calculation: 480 R Axis:   -63 Text Interpretation: Sinus rhythm LAE, consider biatrial enlargement Left anterior fascicular block Abnormal R-wave progression, late transition Confirmed by Addison Lank [phone]) on 09/27/2022 4:53:01 AM  Radiology CT ANGIO CHEST PE W OR WO CONTRAST  Result Date: 09/27/2022 CLINICAL DATA:  Pulmonary embolism (PE) suspected, high prob,.evaluation of 2 days of chest pain and sob. With hx of same. EXAM: CT ANGIOGRAPHY CHEST WITH CONTRAST TECHNIQUE: Multidetector CT imaging of the chest was performed using the standard protocol during bolus administration of intravenous contrast. Multiplanar CT image reconstructions and MIPs were obtained to evaluate the vascular anatomy. RADIATION DOSE REDUCTION: This exam was performed according to the departmental dose-optimization program which includes automated exposure control, adjustment of the mA and/or kV according to patient size and/or use of iterative reconstruction technique. CONTRAST:  51m OMNIPAQUE IOHEXOL 350 MG/ML SOLN COMPARISON:  CT chest 06/05/2019 FINDINGS: Cardiovascular: Satisfactory opacification of the pulmonary arteries to the  segmental level. No evidence of pulmonary embolism. Limited evaluation of the subsegmental level due to respiratory motion artifact. The main pulmonary artery is enlarged measuring up to 3.5 cm. Normal heart size. No significant pericardial effusion. The thoracic aorta is normal in caliber. No atherosclerotic plaque of the thoracic aorta. No coronary artery calcifications. Likely coronary artery stent placement. Mediastinum/Nodes: No enlarged mediastinal, hilar, or axillary lymph nodes. Thyroid gland, trachea, and esophagus demonstrate no significant findings. Lungs/Pleura: Expiratory phase of respiration. Similar-appearing chronic gas at the right apex (6:10). No focal consolidation. Stable 6 mm left upper lobe pulmonary nodule. No new pulmonary nodule. No pulmonary mass. No pleural effusion. No pneumothorax. Upper Abdomen: No acute abnormality. Musculoskeletal: Review of the MIP images confirms the above findings. IMPRESSION: 1. No central or segmental pulmonary embolus. Limited evaluation of the subsegmental level. 2. Enlarged main pulmonary artery-correlate for pulmonary hypertension. 3. No acute intrathoracic abnormality Electronically Signed   By: MIven FinnM.D.   On: 09/27/2022 03:22   DG Chest Portable 1 View  Result Date: 09/27/2022 CLINICAL DATA:  Chest pain EXAM: PORTABLE CHEST 1 VIEW COMPARISON:  11/15/2021 FINDINGS: The heart size and mediastinal contours are within normal limits. Both lungs are clear. The visualized skeletal structures are unremarkable. IMPRESSION: No active disease. Electronically Signed   By: KUlyses JarredM.D.   On: 09/27/2022 02:29    Medications Ordered in ED Medications  0.9 %  sodium chloride infusion (has no administration in time range)  nitroGLYCERIN (NITROGLYN) 2 % ointment 1 inch (1 inch Topical Given 09/27/22 0210)  iohexol (OMNIPAQUE) 350 MG/ML injection 75 mL (75 mLs Intravenous Contrast Given 09/27/22 0251)  sodium chloride 0.9 % bolus 1,000 mL (0 mLs  Intravenous Stopped 09/27/22 0430)  metoprolol tartrate (LOPRESSOR) injection 5 mg (5 mg Intravenous Given 09/27/22 0325)  potassium chloride 10 mEq in 100 mL IVPB (0 mEq Intravenous Stopped 09/27/22 0424)                                                                                                                                     Procedures .Critical Care  Performed by: CFatima Blank MD Authorized by: CLeonette Monarch  Grayce Sessions, MD   Critical care provider statement:    Critical care time (minutes):  45   Critical care time was exclusive of:  Separately billable procedures and treating other patients   Critical care was necessary to treat or prevent imminent or life-threatening deterioration of the following conditions:  Circulatory failure   Critical care was time spent personally by me on the following activities:  Development of treatment plan with patient or surrogate, discussions with consultants, evaluation of patient's response to treatment, examination of patient, obtaining history from patient or surrogate, review of old charts, re-evaluation of patient's condition, pulse oximetry, ordering and review of radiographic studies, ordering and review of laboratory studies and ordering and performing treatments and interventions   (including critical care time)  Medical Decision Making / ED Course   Medical Decision Making Amount and/or Complexity of Data Reviewed Labs: ordered. Decision-making details documented in ED Course. Radiology: ordered and independent interpretation performed. Decision-making details documented in ED Course. ECG/medicine tests: ordered and independent interpretation performed. Decision-making details documented in ED Course.  Risk Prescription drug management. Decision regarding hospitalization.   This patient presents to the ED for concern of tachycardia, chest pain and shortness of breath, this involves an extensive number of treatment options,  and is a complaint that carries with it a high risk of complications and morbidity. The differential diagnosis includes but not limited to tachydysrhythmia, PE, ACS, pneumothorax, thyroid storm, electrolyte/metabolic derangement, severe anemia.  Will assess for evidence of heart failure as well.  Will rule out pregnancy related process.  EKG appears to be sinus tachycardia in the 140s, but ossible flutter or SVT. Chest x-ray without evidence of pneumonia, pneumothorax, pulmonary edema or pleural effusion. CBC with leukocytosis.  No anemia. CMP with mild hypokalemia at 2.9 without other electrolyte derangements or renal sufficiency.  Mild hyperglycemia without DKA. Initial troponin negative. hCG negative. CT PE study negative for PE, pneumonia, pneumothorax.  On telemetry, patient's heart rate appears to be fluctuating.   Will provide patient with IV fluids and metoprolol to better assess.  Follow IVF and metop, HR down to 80s and NSR. EKG without acute ischemic changes. Second troponin slightly elevated but this is anticipated given the duration of patient's tachycardia. Her pain completely resolved with the improved heart rate. This is likely related to her being noncompliant with her metoprolol.  I reviewed her chart and noted cardiology notes from [facility] stating that the patient's has had numerous workups for palpitations and was placed on metoprolol.  Recommended she start taking her metoprolol again and follow-up closely with her cardiologist.  I do not feel that patient is having episode of ACS at this time.        Final Clinical Impression(s) / ED Diagnoses Final diagnoses:  SVT (supraventricular tachycardia)   The patient appears reasonably screened and/or stabilized for discharge and I doubt any other medical condition or other [facility] requiring further screening, evaluation, or treatment in the ED at this time. I have discussed the findings, Dx and Tx plan with the patient/family who  expressed understanding and agree(s) with the plan. Discharge instructions discussed at length. The patient/family was given strict return precautions who verbalized understanding of the instructions. No further questions at time of discharge.  Disposition: Discharge  Condition: Good  ED Discharge Orders          Ordered    metoprolol succinate (TOPROL-XL) 25 MG 24 hr tablet  Daily        09/27/22 [phone]  Follow Up: Marina Goodell, [place] [address] [phone]  Call  to schedule an appointment for close follow up  Fenton Foy, NP [place] [address] [phone]  Call  as needed           This chart was dictated using voice recognition software.  Despite best efforts to proofread,  errors can occur which can change the documentation meaning.    Fatima Blank, MD 09/27/22 [phone]

## 2023-08-24 ENCOUNTER — Other Ambulatory Visit: Payer: Self-pay

## 2023-08-24 ENCOUNTER — Inpatient Hospital Stay (HOSPITAL_COMMUNITY)
Admission: EM | Admit: 2023-08-24 | Discharge: 2023-08-26 | DRG: 194 | Disposition: A | Discharge status: Home / Self Care | Payer: BC Managed Care – PPO | Attending: Internal Medicine | Admitting: Internal Medicine

## 2023-08-24 ENCOUNTER — Emergency Department (HOSPITAL_COMMUNITY): Payer: BC Managed Care – PPO

## 2023-08-24 ENCOUNTER — Encounter (HOSPITAL_COMMUNITY): Payer: Self-pay | Admitting: Emergency Medicine

## 2023-08-24 DIAGNOSIS — J101 Influenza due to other identified influenza virus with other respiratory manifestations: Principal | ICD-10-CM | POA: Diagnosis present

## 2023-08-24 DIAGNOSIS — J383 Other diseases of vocal cords: Secondary | ICD-10-CM | POA: Diagnosis present

## 2023-08-24 DIAGNOSIS — I110 Hypertensive heart disease with heart failure: Secondary | ICD-10-CM | POA: Diagnosis present

## 2023-08-24 DIAGNOSIS — J45909 Unspecified asthma, uncomplicated: Secondary | ICD-10-CM | POA: Diagnosis present

## 2023-08-24 DIAGNOSIS — I252 Old myocardial infarction: Secondary | ICD-10-CM

## 2023-08-24 DIAGNOSIS — I4719 Other supraventricular tachycardia: Secondary | ICD-10-CM | POA: Diagnosis present

## 2023-08-24 DIAGNOSIS — Z800 Family history of malignant neoplasm of digestive organs: Secondary | ICD-10-CM

## 2023-08-24 DIAGNOSIS — Z79899 Other long term (current) drug therapy: Secondary | ICD-10-CM

## 2023-08-24 DIAGNOSIS — Z789 Other specified health status: Secondary | ICD-10-CM

## 2023-08-24 DIAGNOSIS — E66813 Obesity, class 3: Secondary | ICD-10-CM | POA: Diagnosis present

## 2023-08-24 DIAGNOSIS — E119 Type 2 diabetes mellitus without complications: Secondary | ICD-10-CM | POA: Diagnosis present

## 2023-08-24 DIAGNOSIS — R079 Chest pain, unspecified: Secondary | ICD-10-CM | POA: Diagnosis present

## 2023-08-24 DIAGNOSIS — Z7984 Long term (current) use of oral hypoglycemic drugs: Secondary | ICD-10-CM

## 2023-08-24 DIAGNOSIS — Z1152 Encounter for screening for COVID-19: Secondary | ICD-10-CM

## 2023-08-24 DIAGNOSIS — I471 Supraventricular tachycardia, unspecified: Secondary | ICD-10-CM | POA: Diagnosis present

## 2023-08-24 DIAGNOSIS — Z825 Family history of asthma and other chronic lower respiratory diseases: Secondary | ICD-10-CM

## 2023-08-24 DIAGNOSIS — Z6841 Body Mass Index (BMI) 40.0 and over, adult: Secondary | ICD-10-CM

## 2023-08-24 DIAGNOSIS — Z7902 Long term (current) use of antithrombotics/antiplatelets: Secondary | ICD-10-CM

## 2023-08-24 DIAGNOSIS — R0902 Hypoxemia: Secondary | ICD-10-CM | POA: Diagnosis present

## 2023-08-24 DIAGNOSIS — E876 Hypokalemia: Secondary | ICD-10-CM | POA: Diagnosis present

## 2023-08-24 DIAGNOSIS — R5381 Other malaise: Secondary | ICD-10-CM | POA: Diagnosis present

## 2023-08-24 DIAGNOSIS — F17210 Nicotine dependence, cigarettes, uncomplicated: Secondary | ICD-10-CM | POA: Diagnosis present

## 2023-08-24 DIAGNOSIS — Z7951 Long term (current) use of inhaled steroids: Secondary | ICD-10-CM

## 2023-08-24 DIAGNOSIS — I5032 Chronic diastolic (congestive) heart failure: Secondary | ICD-10-CM | POA: Diagnosis present

## 2023-08-24 DIAGNOSIS — J111 Influenza due to unidentified influenza virus with other respiratory manifestations: Principal | ICD-10-CM

## 2023-08-24 DIAGNOSIS — I2510 Atherosclerotic heart disease of native coronary artery without angina pectoris: Secondary | ICD-10-CM | POA: Diagnosis present

## 2023-08-24 LAB — RESP PANEL BY RT-PCR (RSV, FLU A&B, COVID)  RVPGX2
Influenza A by PCR: POSITIVE — AB
Influenza B by PCR: NEGATIVE
Resp Syncytial Virus by PCR: NEGATIVE
SARS Coronavirus 2 by RT PCR: NEGATIVE

## 2023-08-24 MED ORDER — LACTATED RINGERS IV SOLN: INTRAVENOUS | Status: DC

## 2023-08-24 MED ORDER — METHYLPREDNISOLONE SODIUM SUCC 125 MG IJ SOLR
125.0000 mg | Freq: Once | INTRAMUSCULAR | Status: AC
  Administered 2023-08-24: 125 mg via INTRAVENOUS
  Filled 2023-08-24: qty 2

## 2023-08-24 MED ORDER — LACTATED RINGERS IV BOLUS
1000.0000 mL | Freq: Once | INTRAVENOUS | Status: AC
  Administered 2023-08-25: 1000 mL via INTRAVENOUS

## 2023-08-24 MED ORDER — ACETAMINOPHEN 500 MG PO TABS
1000.0000 mg | ORAL_TABLET | Freq: Once | ORAL | Status: AC
  Administered 2023-08-24: 1000 mg via ORAL
  Filled 2023-08-24: qty 2

## 2023-08-24 MED ORDER — IPRATROPIUM-ALBUTEROL 0.5-2.5 (3) MG/3ML IN SOLN
3.0000 mL | Freq: Once | RESPIRATORY_TRACT | Status: AC
  Administered 2023-08-24: 3 mL via RESPIRATORY_TRACT
  Filled 2023-08-24: qty 3

## 2023-08-24 NOTE — Sepsis Progress Note (Signed)
Elink following code sepsis °

## 2023-08-24 NOTE — ED Triage Notes (Signed)
Pt BIBA from home SHOB/respiratory distress. Hx of asthma, pt febrile. Endorses chest pain rates 7/10.  A&Ox4  5mg  albuterol and 0.5mg  atrovent given enroute

## 2023-08-25 ENCOUNTER — Encounter (HOSPITAL_COMMUNITY): Payer: Self-pay | Admitting: Family Medicine

## 2023-08-25 DIAGNOSIS — I471 Supraventricular tachycardia, unspecified: Secondary | ICD-10-CM | POA: Diagnosis present

## 2023-08-25 DIAGNOSIS — Z1152 Encounter for screening for COVID-19: Secondary | ICD-10-CM | POA: Diagnosis not present

## 2023-08-25 DIAGNOSIS — E66813 Obesity, class 3: Secondary | ICD-10-CM | POA: Diagnosis present

## 2023-08-25 DIAGNOSIS — I5032 Chronic diastolic (congestive) heart failure: Secondary | ICD-10-CM | POA: Diagnosis present

## 2023-08-25 DIAGNOSIS — I252 Old myocardial infarction: Secondary | ICD-10-CM | POA: Diagnosis not present

## 2023-08-25 DIAGNOSIS — E876 Hypokalemia: Secondary | ICD-10-CM

## 2023-08-25 DIAGNOSIS — E119 Type 2 diabetes mellitus without complications: Secondary | ICD-10-CM | POA: Diagnosis present

## 2023-08-25 DIAGNOSIS — I4719 Other supraventricular tachycardia: Secondary | ICD-10-CM | POA: Diagnosis present

## 2023-08-25 DIAGNOSIS — J101 Influenza due to other identified influenza virus with other respiratory manifestations: Secondary | ICD-10-CM | POA: Diagnosis present

## 2023-08-25 DIAGNOSIS — Z6841 Body Mass Index (BMI) 40.0 and over, adult: Secondary | ICD-10-CM | POA: Diagnosis not present

## 2023-08-25 DIAGNOSIS — R5381 Other malaise: Secondary | ICD-10-CM | POA: Diagnosis present

## 2023-08-25 DIAGNOSIS — J383 Other diseases of vocal cords: Secondary | ICD-10-CM | POA: Diagnosis present

## 2023-08-25 DIAGNOSIS — Z7984 Long term (current) use of oral hypoglycemic drugs: Secondary | ICD-10-CM | POA: Diagnosis not present

## 2023-08-25 DIAGNOSIS — J4550 Severe persistent asthma, uncomplicated: Secondary | ICD-10-CM

## 2023-08-25 DIAGNOSIS — R079 Chest pain, unspecified: Secondary | ICD-10-CM | POA: Diagnosis not present

## 2023-08-25 DIAGNOSIS — Z7902 Long term (current) use of antithrombotics/antiplatelets: Secondary | ICD-10-CM | POA: Diagnosis not present

## 2023-08-25 DIAGNOSIS — R0902 Hypoxemia: Secondary | ICD-10-CM | POA: Diagnosis present

## 2023-08-25 DIAGNOSIS — Z7951 Long term (current) use of inhaled steroids: Secondary | ICD-10-CM | POA: Diagnosis not present

## 2023-08-25 DIAGNOSIS — F17210 Nicotine dependence, cigarettes, uncomplicated: Secondary | ICD-10-CM | POA: Diagnosis present

## 2023-08-25 DIAGNOSIS — Z825 Family history of asthma and other chronic lower respiratory diseases: Secondary | ICD-10-CM | POA: Diagnosis not present

## 2023-08-25 DIAGNOSIS — Z79899 Other long term (current) drug therapy: Secondary | ICD-10-CM | POA: Diagnosis not present

## 2023-08-25 DIAGNOSIS — J45909 Unspecified asthma, uncomplicated: Secondary | ICD-10-CM | POA: Diagnosis present

## 2023-08-25 DIAGNOSIS — Z789 Other specified health status: Secondary | ICD-10-CM | POA: Diagnosis not present

## 2023-08-25 DIAGNOSIS — I110 Hypertensive heart disease with heart failure: Secondary | ICD-10-CM | POA: Diagnosis present

## 2023-08-25 DIAGNOSIS — I2510 Atherosclerotic heart disease of native coronary artery without angina pectoris: Secondary | ICD-10-CM | POA: Diagnosis present

## 2023-08-25 DIAGNOSIS — Z800 Family history of malignant neoplasm of digestive organs: Secondary | ICD-10-CM | POA: Diagnosis not present

## 2023-08-25 LAB — CBC
HCT: 40.1 % (ref 36.0–46.0)
Hemoglobin: 12.7 g/dL (ref 12.0–15.0)
MCH: 29.4 pg (ref 26.0–34.0)
MCHC: 31.7 g/dL (ref 30.0–36.0)
MCV: 92.8 fL (ref 80.0–100.0)
Platelets: 358 10*3/uL (ref 150–400)
RBC: 4.32 MIL/uL (ref 3.87–5.11)
RDW: 13.6 % (ref 11.5–15.5)
WBC: 8.1 10*3/uL (ref 4.0–10.5)
nRBC: 0 % (ref 0.0–0.2)

## 2023-08-25 LAB — COMPREHENSIVE METABOLIC PANEL
ALT: 15 U/L (ref 0–44)
AST: 21 U/L (ref 15–41)
Albumin: 3.5 g/dL (ref 3.5–5.0)
Alkaline Phosphatase: 103 U/L (ref 38–126)
Anion gap: 9 (ref 5–15)
BUN: 13 mg/dL (ref 6–20)
CO2: 26 mmol/L (ref 22–32)
Calcium: 8.5 mg/dL — ABNORMAL LOW (ref 8.9–10.3)
Chloride: 103 mmol/L (ref 98–111)
Creatinine, Ser: 0.84 mg/dL (ref 0.44–1.00)
GFR, Estimated: >60 mL/min (ref >60)
Glucose, Bld: 155 mg/dL — ABNORMAL HIGH (ref 70–99)
Potassium: 2.9 mmol/L — ABNORMAL LOW (ref 3.5–5.1)
Sodium: 138 mmol/L (ref 135–145)
Total Bilirubin: 0.6 mg/dL (ref 0.0–1.2)
Total Protein: 7.8 g/dL (ref 6.5–8.1)

## 2023-08-25 LAB — CBC WITH DIFFERENTIAL/PLATELET
Abs Immature Granulocytes: 0.04 10*3/uL (ref 0.00–0.07)
Basophils Absolute: 0 10*3/uL (ref 0.0–0.1)
Basophils Relative: 0 %
Eosinophils Absolute: 0 10*3/uL (ref 0.0–0.5)
Eosinophils Relative: 0 %
HCT: 40.2 % (ref 36.0–46.0)
Hemoglobin: 13 g/dL (ref 12.0–15.0)
Immature Granulocytes: 0 %
Lymphocytes Relative: 8 %
Lymphs Abs: 0.8 10*3/uL (ref 0.7–4.0)
MCH: 29.7 pg (ref 26.0–34.0)
MCHC: 32.3 g/dL (ref 30.0–36.0)
MCV: 91.8 fL (ref 80.0–100.0)
Monocytes Absolute: 0.5 10*3/uL (ref 0.1–1.0)
Monocytes Relative: 5 %
Neutro Abs: 8.6 10*3/uL — ABNORMAL HIGH (ref 1.7–7.7)
Neutrophils Relative %: 87 %
Platelets: 294 10*3/uL (ref 150–400)
RBC: 4.38 MIL/uL (ref 3.87–5.11)
RDW: 13.4 % (ref 11.5–15.5)
WBC: 10 10*3/uL (ref 4.0–10.5)
nRBC: 0 % (ref 0.0–0.2)

## 2023-08-25 LAB — BASIC METABOLIC PANEL
Anion gap: 15 (ref 5–15)
BUN: 13 mg/dL (ref 6–20)
CO2: 23 mmol/L (ref 22–32)
Calcium: 9 mg/dL (ref 8.9–10.3)
Chloride: 102 mmol/L (ref 98–111)
Creatinine, Ser: 0.57 mg/dL (ref 0.44–1.00)
GFR, Estimated: >60 mL/min (ref >60)
Glucose, Bld: 239 mg/dL — ABNORMAL HIGH (ref 70–99)
Potassium: 3.3 mmol/L — ABNORMAL LOW (ref 3.5–5.1)
Sodium: 140 mmol/L (ref 135–145)

## 2023-08-25 LAB — GLUCOSE, CAPILLARY
Glucose-Capillary: 211 mg/dL — ABNORMAL HIGH (ref 70–99)
Glucose-Capillary: 219 mg/dL — ABNORMAL HIGH (ref 70–99)
Glucose-Capillary: 166 mg/dL — ABNORMAL HIGH (ref 70–99)
Glucose-Capillary: 145 mg/dL — ABNORMAL HIGH (ref 70–99)

## 2023-08-25 LAB — TROPONIN I (HIGH SENSITIVITY)
Troponin I (High Sensitivity): 39 ng/L — ABNORMAL HIGH (ref <18)
Troponin I (High Sensitivity): 31 ng/L — ABNORMAL HIGH (ref <18)

## 2023-08-25 LAB — I-STAT CG4 LACTIC ACID, ED: Lactic Acid, Venous: 1.7 mmol/L (ref 0.5–1.9)

## 2023-08-25 LAB — MAGNESIUM
Magnesium: 1.7 mg/dL (ref 1.7–2.4)
Magnesium: 2 mg/dL (ref 1.7–2.4)

## 2023-08-25 LAB — D-DIMER, QUANTITATIVE: D-Dimer, Quant: 0.99 ug{FEU}/mL — ABNORMAL HIGH (ref 0.00–0.50)

## 2023-08-25 LAB — BRAIN NATRIURETIC PEPTIDE: B Natriuretic Peptide: 226.2 pg/mL — ABNORMAL HIGH (ref 0.0–100.0)

## 2023-08-25 LAB — PROTIME-INR
INR: 1.1 (ref 0.8–1.2)
Prothrombin Time: 14.2 s (ref 11.4–15.2)

## 2023-08-25 LAB — HCG, SERUM, QUALITATIVE: Preg, Serum: NEGATIVE

## 2023-08-25 LAB — APTT: aPTT: 35 s (ref 24–36)

## 2023-08-25 LAB — PHOSPHORUS: Phosphorus: 2.6 mg/dL (ref 2.5–4.6)

## 2023-08-25 MED ORDER — POTASSIUM CHLORIDE 10 MEQ/100ML IV SOLN
10.0000 meq | INTRAVENOUS | Status: AC
  Administered 2023-08-25 (×3): 10 meq via INTRAVENOUS
  Filled 2023-08-25 (×3): qty 100

## 2023-08-25 MED ORDER — ACETAMINOPHEN 650 MG RE SUPP: 650.0000 mg | Freq: Four times a day (QID) | RECTAL | Status: DC | PRN

## 2023-08-25 MED ORDER — GUAIFENESIN 100 MG/5ML PO LIQD: 5.0000 mL | ORAL | Status: DC | PRN

## 2023-08-25 MED ORDER — UMECLIDINIUM BROMIDE 62.5 MCG/INH IN AEPB: 1.0000 | INHALATION_SPRAY | Freq: Every day | RESPIRATORY_TRACT | Status: DC

## 2023-08-25 MED ORDER — MOMETASONE FURO-FORMOTEROL FUM 200-5 MCG/ACT IN AERO: 2.0000 | INHALATION_SPRAY | Freq: Two times a day (BID) | RESPIRATORY_TRACT | Status: DC

## 2023-08-25 MED ORDER — SODIUM CHLORIDE 0.9% FLUSH
3.0000 mL | Freq: Two times a day (BID) | INTRAVENOUS | Status: DC
  Administered 2023-08-25 – 2023-08-26 (×3): 3 mL via INTRAVENOUS

## 2023-08-25 MED ORDER — HYDRALAZINE HCL 20 MG/ML IJ SOLN: 10.0000 mg | INTRAMUSCULAR | Status: DC | PRN

## 2023-08-25 MED ORDER — TRAZODONE HCL 50 MG PO TABS: 50.0000 mg | ORAL_TABLET | Freq: Every evening | ORAL | Status: DC | PRN

## 2023-08-25 MED ORDER — METOPROLOL TARTRATE 5 MG/5ML IV SOLN: 5.0000 mg | INTRAVENOUS | Status: DC | PRN

## 2023-08-25 MED ORDER — METOPROLOL SUCCINATE ER 50 MG PO TB24
25.0000 mg | ORAL_TABLET | Freq: Every day | ORAL | Status: DC
  Administered 2023-08-25 – 2023-08-26 (×2): 25 mg via ORAL
  Filled 2023-08-25 (×2): qty 1

## 2023-08-25 MED ORDER — ONDANSETRON HCL 4 MG/2ML IJ SOLN: 4.0000 mg | Freq: Four times a day (QID) | INTRAMUSCULAR | Status: DC | PRN

## 2023-08-25 MED ORDER — ALBUTEROL SULFATE (2.5 MG/3ML) 0.083% IN NEBU
10.0000 mg/h | INHALATION_SOLUTION | Freq: Once | RESPIRATORY_TRACT | Status: AC
  Administered 2023-08-25: 10 mg/h via RESPIRATORY_TRACT
  Filled 2023-08-25: qty 12

## 2023-08-25 MED ORDER — IPRATROPIUM-ALBUTEROL 0.5-2.5 (3) MG/3ML IN SOLN: 3.0000 mL | RESPIRATORY_TRACT | Status: DC | PRN

## 2023-08-25 MED ORDER — POTASSIUM CHLORIDE 20 MEQ PO PACK
40.0000 meq | PACK | Freq: Two times a day (BID) | ORAL | Status: DC
  Filled 2023-08-25: qty 2

## 2023-08-25 MED ORDER — ENOXAPARIN SODIUM 40 MG/0.4ML IJ SOSY
40.0000 mg | PREFILLED_SYRINGE | INTRAMUSCULAR | Status: DC
  Administered 2023-08-25 – 2023-08-26 (×2): 40 mg via SUBCUTANEOUS
  Filled 2023-08-25 (×2): qty 0.4

## 2023-08-25 MED ORDER — POLYETHYLENE GLYCOL 3350 17 G PO PACK: 17.0000 g | PACK | Freq: Every day | ORAL | Status: DC | PRN

## 2023-08-25 MED ORDER — SENNOSIDES-DOCUSATE SODIUM 8.6-50 MG PO TABS: 1.0000 | ORAL_TABLET | Freq: Every evening | ORAL | Status: DC | PRN

## 2023-08-25 MED ORDER — ONDANSETRON HCL 4 MG PO TABS: 4.0000 mg | ORAL_TABLET | Freq: Four times a day (QID) | ORAL | Status: DC | PRN

## 2023-08-25 MED ORDER — OXYCODONE HCL 5 MG PO TABS
5.0000 mg | ORAL_TABLET | ORAL | Status: DC | PRN
  Administered 2023-08-25: 5 mg via ORAL
  Filled 2023-08-25: qty 1

## 2023-08-25 MED ORDER — ACETAMINOPHEN 325 MG PO TABS
650.0000 mg | ORAL_TABLET | Freq: Four times a day (QID) | ORAL | Status: DC | PRN
  Administered 2023-08-25: 650 mg via ORAL
  Filled 2023-08-25 (×2): qty 2

## 2023-08-25 MED ORDER — ALBUTEROL SULFATE (2.5 MG/3ML) 0.083% IN NEBU: 2.5000 mg | INHALATION_SOLUTION | RESPIRATORY_TRACT | Status: DC | PRN

## 2023-08-25 MED ORDER — INSULIN ASPART 100 UNIT/ML IJ SOLN
0.0000 [IU] | Freq: Three times a day (TID) | INTRAMUSCULAR | Status: DC
  Filled 2023-08-25: qty 0.09

## 2023-08-25 MED ORDER — OSELTAMIVIR PHOSPHATE 75 MG PO CAPS
75.0000 mg | ORAL_CAPSULE | Freq: Two times a day (BID) | ORAL | Status: DC
  Administered 2023-08-25 – 2023-08-26 (×3): 75 mg via ORAL
  Filled 2023-08-25 (×3): qty 1

## 2023-08-25 MED ORDER — INSULIN ASPART 100 UNIT/ML IJ SOLN
0.0000 [IU] | Freq: Every day | INTRAMUSCULAR | Status: DC
  Filled 2023-08-25: qty 0.05

## 2023-08-25 NOTE — Sepsis Progress Note (Addendum)
0115: Secure chat with bedside RN to determine the status of blood cultures and lactic acid. Bedside wrote that patient is a difficult stick and IV team was unable to get enough blood for blood cultures. Bedside RN said she would f/u with provider about antibiotics, since they have not been ordered yet. Will continue to follow code sepsis.   0230: Notified provider of need to order antibiotics. Will continue to monitor code sepsis.

## 2023-08-25 NOTE — Hospital Course (Addendum)
Brief Narrative:  48 year old with history of asthma, HTN, paroxysmal SVT, CAD, vocal cord dysfunction presents with nasal congestion shortness of breath and chest discomfort.  Patient reporting of URI type of symptoms along with malaise for 3-4 days.  Upon admission diagnosed with influenza A started on Tamiflu During the course of hospitalization over 48 hours she significantly improved in terms of her respiration, chest pain and shortness of breath.  Today we will discharge her in stable condition.  Assessment & Plan:  Principal Problem:   Influenza A Active Problems:   Hypokalemia   Persistent asthma without complication   CAD (coronary artery disease)   Type 2 diabetes mellitus without complication, without long-term current use of insulin (HCC)   Chronic heart failure with preserved ejection fraction (HFpEF) (HCC)   Paroxysmal SVT (supraventricular tachycardia) (HCC)   Chest pain    Influenza A  She is doing significantly well, will prescribe Tamiflu to complete her course.  Will also refill her bronchodilators/albuterol inhaler.   Atypical chest pain, resolved Troponins are around 30 and flat trending down.  Likely non-ACS related, EKG is nonischemic.  D-dimer 0.99.   Hypokalemia Replete as needed.   History of asthma Continue home bronchodilators   Paroxysmal SVT On Toprol.    Essential hypertension - She is on home losartan and Toprol.  History of CAD - Continue home Plavix.  Diabetes mellitus type 2 - Patient is adamant that she is not any medications at home.  Her A1c 6.5.    DVT prophylaxis: enoxaparin (LOVENOX) injection 40 mg Start: 08/25/23 1000    Code Status: Full Code Family Communication:   Status is: Inpatient DC today   Subjective: Patient is doing significantly well and wanting to go home.  No other complaints.   Examination:  General exam: Appears calm and comfortable  Respiratory system: Clear to auscultation. Respiratory effort  normal. Cardiovascular system: S1 & S2 heard, RRR. No JVD, murmurs, rubs, gallops or clicks. No pedal edema. Gastrointestinal system: Abdomen is nondistended, soft and nontender. No organomegaly or masses felt. Normal bowel sounds heard. Central nervous system: Alert and oriented. No focal neurological deficits. Extremities: Symmetric 5 x 5 power. Skin: No rashes, lesions or ulcers Psychiatry: Judgement and insight appear normal. Mood & affect appropriate.

## 2023-08-25 NOTE — Progress Notes (Signed)
PROGRESS NOTE    Keyatta Waterhouse  FAO:[id number] DOB: 1976/01/05 DOA: 08/24/2023 PCP: Ivonne Andrew, NP    Brief Narrative:  48 year old with history of asthma, HTN, paroxysmal SVT, CAD, vocal cord dysfunction presents with nasal congestion shortness of breath and chest discomfort.  Patient reporting of URI type of symptoms along with malaise for 3-4 days.  Upon admission diagnosed with influenza A started on Tamiflu   Assessment & Plan:  Principal Problem:   Influenza A Active Problems:   Hypokalemia   Persistent asthma without complication   CAD (coronary artery disease)   Type 2 diabetes mellitus without complication, without long-term current use of insulin (HCC)   Chronic heart failure with preserved ejection fraction (HFpEF) (HCC)   Paroxysmal SVT (supraventricular tachycardia) (HCC)   Chest pain    Influenza A  -Tamiflu for 5 days - Bronchodilators, I-S/flutter valve.   Atypical chest pain  Troponins are around 30 and flat trending down.  Likely non-ACS related, EKG is nonischemic.  D-dimer 0.99.   Hypokalemia Replete as needed.   History of asthma Can any bronchodilators   Paroxysmal SVT On Toprol.  IV as needed.    DVT prophylaxis: enoxaparin (LOVENOX) injection 40 mg Start: 08/25/23 1000    Code Status: Full Code Family Communication:   Status is: Inpatient Remains inpatient appropriate because: Ongoing management for fluid and electrolytes I have explained patient that if she continues to decline hospital care then she will be discharged   Subjective: Patient refusing blood draws this morning, insulin/sliding scale for hyperglycemia.    Examination:  General exam: Appears calm and comfortable  Respiratory system: Clear to auscultation. Respiratory effort normal. Cardiovascular system: S1 & S2 heard, RRR. No JVD, murmurs, rubs, gallops or clicks. No pedal edema. Gastrointestinal system: Abdomen is nondistended, soft and nontender. No organomegaly  or masses felt. Normal bowel sounds heard. Central nervous system: Alert and oriented. No focal neurological deficits. Extremities: Symmetric 5 x 5 power. Skin: No rashes, lesions or ulcers Psychiatry: Judgement and insight appear normal. Mood & affect appropriate.                Diet Orders (From admission, onward)     Start     Ordered   08/25/23 0504  Diet Heart Room service appropriate? Yes; Fluid consistency: Thin  Diet effective now       Question Answer Comment  Room service appropriate? Yes   Fluid consistency: Thin      08/25/23 0505            Objective: Vitals:   08/25/23 0600 08/25/23 0623 08/25/23 0740 08/25/23 0746  BP: (!) 140/64 (!) 134/121 (!) 148/86   Pulse: 95 96 (!) 105   Resp:   20   Temp:  97.9 F (36.6 C) 98.6 F (37 C)   TempSrc:  Oral Oral   SpO2: 94% 96% 95%   Weight:    135.4 kg  Height:    5\' 9"  (1.753 m)    Intake/Output Summary (Last 24 hours) at 08/25/2023 1134 Last data filed at 08/25/2023 0900 Gross per 24 hour  Intake 1120.09 ml  Output --  Net 1120.09 ml   Filed Weights   08/25/23 0746  Weight: 135.4 kg    Scheduled Meds:  enoxaparin (LOVENOX) injection  40 mg Subcutaneous Q24H   insulin aspart  0-5 Units Subcutaneous QHS   insulin aspart  0-9 Units Subcutaneous TID WC   metoprolol succinate  25 mg Oral Daily   oseltamivir  75 mg Oral BID   sodium chloride flush  3 mL Intravenous Q12H   Continuous Infusions:  Nutritional status     Body mass index is 44.08 kg/m.  Data Reviewed:   CBC: Recent Labs  Lab 08/25/23 0135  WBC 10.0  NEUTROABS 8.6*  HGB 13.0  HCT 40.2  MCV 91.8  PLT 294   Basic Metabolic Panel: Recent Labs  Lab 08/25/23 0135 08/25/23 0619 08/25/23 1030  NA 138  --   --   K 2.9*  --   --   CL 103  --   --   CO2 26  --   --   GLUCOSE 155*  --   --   BUN 13  --   --   CREATININE 0.84  --   --   CALCIUM 8.5*  --   --   MG  --  1.7  --   PHOS  --   --  2.6   GFR: Estimated  Creatinine Clearance: 120.1 mL/min (by C-G formula based on SCr of 0.84 mg/dL). Liver Function Tests: Recent Labs  Lab 08/25/23 0135  AST 21  ALT 15  ALKPHOS 103  BILITOT 0.6  PROT 7.8  ALBUMIN 3.5   No results for input(s): "LIPASE", "AMYLASE" in the last 168 hours. No results for input(s): "AMMONIA" in the last 168 hours. Coagulation Profile: Recent Labs  Lab 08/24/23 0002  INR 1.1   Cardiac Enzymes: No results for input(s): "CKTOTAL", "CKMB", "CKMBINDEX", "TROPONINI" in the last 168 hours. BNP (last 3 results) No results for input(s): "PROBNP" in the last 8760 hours. HbA1C: No results for input(s): "HGBA1C" in the last 72 hours. CBG: Recent Labs  Lab 08/25/23 0810 08/25/23 1126  GLUCAP 211* 219*   Lipid Profile: No results for input(s): "CHOL", "HDL", "LDLCALC", "TRIG", "CHOLHDL", "LDLDIRECT" in the last 72 hours. Thyroid Function Tests: No results for input(s): "TSH", "T4TOTAL", "FREET4", "T3FREE", "THYROIDAB" in the last 72 hours. Anemia Panel: No results for input(s): "VITAMINB12", "FOLATE", "FERRITIN", "TIBC", "IRON", "RETICCTPCT" in the last 72 hours. Sepsis Labs: Recent Labs  Lab 08/25/23 0146  LATICACIDVEN 1.7    Recent Results (from the past 240 hours)  Resp panel by RT-PCR (RSV, Flu A&B, Covid) Anterior Nasal Swab     Status: Abnormal   Collection Time: 08/24/23 10:35 PM   Specimen: Anterior Nasal Swab  Result Value Ref Range Status   SARS Coronavirus 2 by RT PCR NEGATIVE NEGATIVE Final    Comment: (NOTE) SARS-CoV-2 target nucleic acids are NOT DETECTED.  The SARS-CoV-2 RNA is generally detectable in upper respiratory specimens during the acute phase of infection. The lowest concentration of SARS-CoV-2 viral copies this assay can detect is 138 copies/mL. A negative result does not preclude SARS-Cov-2 infection and should not be used as the sole basis for treatment or other patient management decisions. A negative result may occur with  improper  specimen collection/handling, submission of specimen other than nasopharyngeal swab, presence of viral mutation(s) within the areas targeted by this assay, and inadequate number of viral copies(<138 copies/mL). A negative result must be combined with clinical observations, patient history, and epidemiological information. The expected result is Negative.  Fact Sheet for Patients:  BloggerCourse.com  Fact Sheet for Healthcare Providers:  SeriousBroker.it  This test is no t yet approved or cleared by the [place] FDA and  has been authorized for detection and/or diagnosis of SARS-CoV-2 by FDA under an Emergency Use Authorization (EUA). This EUA will remain  in effect (  meaning this test can be used) for the duration of the COVID-19 declaration under Section 564(b)(1) of the Act, 21 U.S.C.section 360bbb-3(b)(1), unless the authorization is terminated  or revoked sooner.       Influenza A by PCR POSITIVE (A) NEGATIVE Final   Influenza B by PCR NEGATIVE NEGATIVE Final    Comment: (NOTE) The Xpert Xpress SARS-CoV-2/FLU/RSV plus assay is intended as an aid in the diagnosis of influenza from Nasopharyngeal swab specimens and should not be used as a sole basis for treatment. Nasal washings and aspirates are unacceptable for Xpert Xpress SARS-CoV-2/FLU/RSV testing.  Fact Sheet for Patients: BloggerCourse.com  Fact Sheet for Healthcare Providers: SeriousBroker.it  This test is not yet approved or cleared by the [place] FDA and has been authorized for detection and/or diagnosis of SARS-CoV-2 by FDA under an Emergency Use Authorization (EUA). This EUA will remain in effect (meaning this test can be used) for the duration of the COVID-19 declaration under Section 564(b)(1) of the Act, 21 U.S.C. section 360bbb-3(b)(1), unless the authorization is terminated or revoked.     Resp  Syncytial Virus by PCR NEGATIVE NEGATIVE Final    Comment: (NOTE) Fact Sheet for Patients: BloggerCourse.com  Fact Sheet for Healthcare Providers: SeriousBroker.it  This test is not yet approved or cleared by the [place] FDA and has been authorized for detection and/or diagnosis of SARS-CoV-2 by FDA under an Emergency Use Authorization (EUA). This EUA will remain in effect (meaning this test can be used) for the duration of the COVID-19 declaration under Section 564(b)(1) of the Act, 21 U.S.C. section 360bbb-3(b)(1), unless the authorization is terminated or revoked.  Performed at [facility], [address] [place], [place] [address]   Blood Culture (routine x 2)     Status: None (Preliminary result)   Collection Time: 08/25/23  1:36 AM   Specimen: BLOOD RIGHT FOREARM  Result Value Ref Range Status   Specimen Description   Final    BLOOD RIGHT FOREARM Performed at [facility], [address], [place], [place] [address]    Special Requests   Final    BOTTLES DRAWN AEROBIC AND ANAEROBIC Blood Culture results may not be optimal due to an inadequate volume of blood received in culture bottles Performed at [facility], [address], [place], [place] [address]    Culture   Final    NO GROWTH < 12 HOURS Performed at [facility], [address] [place], [place] [address]    Report Status PENDING  Incomplete         Radiology Studies: DG Chest Port 1 View Result Date: 08/24/2023 CLINICAL DATA:  Short of breath, respiratory distress, sepsis EXAM: PORTABLE CHEST 1 VIEW COMPARISON:  09/27/2022 FINDINGS: Single frontal view of the chest demonstrates an enlarged cardiac silhouette. There is increased central vascular prominence, without acute airspace disease, effusion, or pneumothorax. No acute bony abnormalities. IMPRESSION: 1. Pulmonary vascular congestion without overt  edema. Electronically Signed   By: Sharlet Salina M.D.   On: 08/24/2023 23:34           LOS: 0 days   Time spent= 35 mins    Miguel Rota, MD [facility]  If 7PM-7AM, please contact night-coverage  08/25/2023, 11:34 AM

## 2023-08-25 NOTE — Progress Notes (Signed)
1610- Patient refusing morning labs. Stated she will allow lab to come back after she eats. Will continue to monitor and make lab aware when she is ready for lab draw. Amin MD made aware.  9604- Patient refusing scheduled novolog/SSI. Amin MD made aware.

## 2023-08-25 NOTE — Plan of Care (Signed)
  Problem: Coping: Goal: Ability to adjust to condition or change in health will improve Outcome: Progressing   Problem: Fluid Volume: Goal: Ability to maintain a balanced intake and output will improve Outcome: Progressing

## 2023-08-25 NOTE — ED Provider Notes (Signed)
[facility] AT [facility] Provider Note   CSN: [id number] Arrival date & time: 08/24/23  2200     History Chief Complaint  Patient presents with   Shortness of Breath    Julia Ingram is a 48 y.o. female with self-reported history of asthma, hypertension, vocal cord dysfunction, CHF presents the emergency room today for evaluation of cough and shortness for the past 2 days.  Reports temperature Tmax 102.2 Fahrenheit.  Reports more chest tightness but denies any distinct chest pain.  Reports nasal congestion but no rhinorrhea.  Reports some nausea but still been to eat and drink.  No vomiting or abdominal pain.  She reports that she has tried her at home inhalers with minimal relief.  Her son is sick at home with similar symptoms.  She reports she has been admitted from asthma before.  She reports that she is allergic to magnesium.  Albuterol is listed as an allergy however patient reports just makes her jittery she is actually prescribed an albuterol inhaler.  Reports tobacco use.   Shortness of Breath Associated symptoms: cough and fever   Associated symptoms: no abdominal pain and no vomiting        Home Medications Prior to Admission medications   Medication Sig Start Date End Date Taking? Authorizing Provider  albuterol (PROVENTIL) (2.5 MG/3ML) 0.083% nebulizer solution Take 3 mLs (2.5 mg total) by nebulization every 6 (six) hours as needed for wheezing or shortness of breath. Patient not taking: Reported on 01/31/2022 11/17/21 11/17/22  Almon Hercules, MD  albuterol (VENTOLIN HFA) 108 (90 Base) MCG/ACT inhaler Inhale 2 puffs into the lungs every 6 (six) hours as needed for wheezing or shortness of breath. Patient not taking: Reported on 01/31/2022 06/08/19   Rodolph Bong, MD  amLODipine (NORVASC) 10 MG tablet Take 1 tablet (10 mg total) by mouth daily. Patient not taking: Reported on 01/31/2022 11/17/21   Almon Hercules, MD   aspirin-acetaminophen-caffeine (EXCEDRIN MIGRAINE) [phone] MG tablet Take 2 tablets by mouth every 6 (six) hours as needed for headache.  Patient not taking: Reported on 01/31/2022    [provider]  azithromycin (ZITHROMAX Z-PAK) 250 MG tablet Take two tablets the first day, and one tablet daily for the next four days Patient not taking: Reported on 01/31/2022 12/04/21   Orion Crook I, NP  dextromethorphan-guaiFENesin (MUCINEX DM) 30-600 MG 12hr tablet Take 1 tablet by mouth 2 (two) times daily. Patient not taking: Reported on 01/31/2022 12/04/21   Orion Crook I, NP  fluticasone (FLONASE) 50 MCG/ACT nasal spray Place 2 sprays into both nostrils daily. Patient not taking: Reported on 12/04/2021 06/08/19   Rodolph Bong, MD  loperamide (IMODIUM A-D) 2 MG tablet Take 1 tablet (2 mg total) by mouth 4 (four) times daily as needed for diarrhea or loose stools. 01/31/22   Ivonne Andrew, NP  losartan (COZAAR) 100 MG tablet Take 1 tablet (100 mg total) by mouth daily. Patient not taking: Reported on 01/31/2022 11/17/21   Almon Hercules, MD  metFORMIN (GLUCOPHAGE) 500 MG tablet Take 1 tablet (500 mg total) by mouth 2 (two) times daily with a meal. Patient not taking: Reported on 01/31/2022 12/05/21   Orion Crook I, NP  metoprolol succinate (TOPROL-XL) 25 MG 24 hr tablet Take 1 tablet (25 mg total) by mouth daily. 09/27/22 10/27/22  Nira Conn, MD  nicotine (NICODERM CQ - DOSED IN MG/24 HOURS) 14 mg/24hr patch Place 1 patch (14 mg total) onto  the skin daily. Patient not taking: Reported on 01/31/2022 11/17/21   Almon Hercules, MD  Olopatadine HCl (PATADAY) 0.7 % SOLN Apply 1 drop to eye daily. Patient not taking: Reported on 01/31/2022 12/04/21   Orion Crook I, NP  pantoprazole (PROTONIX) 40 MG tablet Take 1 tablet (40 mg total) by mouth daily at 6 (six) AM. Patient not taking: Reported on 12/04/2021 11/17/21   Almon Hercules, MD  Polyethyl Glycol-Propyl Glycol (SYSTANE) 0.4-0.3 %  SOLN Apply 2 drops to eye 2 (two) times daily as needed (dry eye). Patient not taking: Reported on 01/31/2022 12/04/21   Orion Crook I, NP  predniSONE (DELTASONE) 50 MG tablet Take 1 tablet (50 mg total) by mouth daily. Patient not taking: Reported on 01/31/2022 12/04/21   Orion Crook I, NP  SYMBICORT 160-4.5 MCG/ACT inhaler 2 puffs daily. Patient not taking: Reported on 01/31/2022 07/28/21   [provider]  umeclidinium bromide (INCRUSE ELLIPTA) 62.5 MCG/INH AEPB Inhale 1 puff into the lungs daily. Patient not taking: Reported on 12/04/2021 11/17/21   Almon Hercules, MD      Allergies    Demerol  [meperidine hcl], Magnesium sulfate, Meperidine, Albuterol, Gadolinium derivatives, Other, and Pirbuterol    Review of Systems   Review of Systems  Constitutional:  Positive for fever.  HENT:  Positive for congestion. Negative for rhinorrhea.   Respiratory:  Positive for cough, chest tightness and shortness of breath.   Gastrointestinal:  Positive for nausea. Negative for abdominal pain, constipation, diarrhea and vomiting.    Physical Exam Updated Vital Signs BP 137/66 (BP Location: Left Arm)   Pulse (!) 114   Temp 99.2 F (37.3 C) (Oral)   Resp 18   SpO2 100%  Physical Exam Vitals and nursing note reviewed.  Constitutional:      General: She is in acute distress.     Appearance: She is not diaphoretic.  Cardiovascular:     Rate and Rhythm: Tachycardia present.  Pulmonary:     Effort: Tachypnea present.     Comments: Diminished breath sounds at the bilateral lower bases.  Does have some wheezing auscultated on coughing.  She does have some tachypnea.  Is only able to speak in short sentences.  Saturation at 90% on room air. Chest:     Chest wall: No tenderness.  Abdominal:     Palpations: Abdomen is soft.     Tenderness: There is no abdominal tenderness.  Musculoskeletal:     Right lower leg: No tenderness. No edema.     Left lower leg: No tenderness. No edema.      Comments: Legs are symmetric in size.  No edema palpated.  Skin:    General: Skin is warm and dry.  Neurological:     Mental Status: She is alert.     ED Results / Procedures / Treatments   Labs (all labs ordered are listed, but only abnormal results are displayed) Labs Reviewed  RESP PANEL BY RT-PCR (RSV, FLU A&B, COVID)  RVPGX2 - Abnormal; Notable for the following components:      Result Value   Influenza A by PCR POSITIVE (*)    All other components within normal limits  BRAIN NATRIURETIC PEPTIDE - Abnormal; Notable for the following components:   B Natriuretic Peptide 226.2 (*)    All other components within normal limits  CBC WITH DIFFERENTIAL/PLATELET - Abnormal; Notable for the following components:   Neutro Abs 8.6 (*)    All other components within normal limits  COMPREHENSIVE METABOLIC PANEL - Abnormal; Notable for the following components:   Potassium 2.9 (*)    Glucose, Bld 155 (*)    Calcium 8.5 (*)    All other components within normal limits  TROPONIN I (HIGH SENSITIVITY) - Abnormal; Notable for the following components:   Troponin I (High Sensitivity) 39 (*)    All other components within normal limits  CULTURE, BLOOD (ROUTINE X 2)  CULTURE, BLOOD (ROUTINE X 2)  PROTIME-INR  APTT  HCG, SERUM, QUALITATIVE  CBC WITH DIFFERENTIAL/PLATELET  URINALYSIS, W/ REFLEX TO CULTURE (INFECTION SUSPECTED)  I-STAT CG4 LACTIC ACID, ED  I-STAT CG4 LACTIC ACID, ED  TROPONIN I (HIGH SENSITIVITY)    EKG EKG Interpretation Date/Time:  Saturday August 24 2023 22:15:27 EST Ventricular Rate:  135 PR Interval:  143 QRS Duration:  93 QT Interval:  268 QTC Calculation: 394 R Axis:   -71  Text Interpretation: Sinus tachycardia Multiform ventricular premature complexes LAE, consider biatrial enlargement Left anterior fascicular block Abnormal R-wave progression, late transition LVH with secondary repolarization abnormality Confirmed by Alona Bene [phone]) on 08/25/2023 5:17:20  AM  Radiology DG Chest Port 1 View Result Date: 08/24/2023 CLINICAL DATA:  Short of breath, respiratory distress, sepsis EXAM: PORTABLE CHEST 1 VIEW COMPARISON:  09/27/2022 FINDINGS: Single frontal view of the chest demonstrates an enlarged cardiac silhouette. There is increased central vascular prominence, without acute airspace disease, effusion, or pneumothorax. No acute bony abnormalities. IMPRESSION: 1. Pulmonary vascular congestion without overt edema. Electronically Signed   By: Sharlet Salina M.D.   On: 08/24/2023 23:34    Procedures .Critical Care  Performed by: Achille Rich, PA-C Authorized by: Achille Rich, PA-C   Critical care provider statement:    Critical care time (minutes):  30   Critical care was necessary to treat or prevent imminent or life-threatening deterioration of the following conditions:  Respiratory failure   Critical care was time spent personally by me on the following activities:  Development of treatment plan with patient or surrogate, discussions with consultants, evaluation of patient's response to treatment, examination of patient, ordering and review of laboratory studies, ordering and review of radiographic studies, ordering and performing treatments and interventions, pulse oximetry, re-evaluation of patient's condition, review of old charts and obtaining history from patient or surrogate   Care discussed with: admitting provider      Medications Ordered in ED Medications  lactated ringers infusion ( Intravenous New Bag/Given 08/25/23 0247)  methylPREDNISolone sodium succinate (SOLU-MEDROL) 125 mg/2 mL injection 125 mg (125 mg Intravenous Given 08/24/23 2313)  lactated ringers bolus 1,000 mL (0 mLs Intravenous Stopped 08/25/23 0231)  ipratropium-albuterol (DUONEB) 0.5-2.5 (3) MG/3ML nebulizer solution 3 mL (3 mLs Nebulization Given 08/24/23 2339)  acetaminophen (TYLENOL) tablet 1,000 mg (1,000 mg Oral Given 08/24/23 2314)  albuterol (PROVENTIL) (2.5  MG/3ML) 0.083% nebulizer solution (10 mg/hr Nebulization Given 08/25/23 0059)    ED Course/ Medical Decision Making/ A&P Clinical Course as of 08/25/23 0437  Wynelle Link Aug 25, 2023  0416 [facility] with cardiology thinks the PVCs are related to the flu. Can continue home meds, patient works third shift and did not take her nightly metoprolol. If there needs to be a formal consultation, he request that the day team be re-paged.  [RR]    Clinical Course User Index [RR] Achille Rich, PA-C    Medical Decision Making Amount and/or Complexity of Data Reviewed Labs: ordered. Radiology: ordered.  Risk OTC drugs. Prescription drug management. Decision regarding hospitalization.   48 y.o. female presents to  the ER for evaluation of SOB with cough and cold symptoms. Differential diagnosis includes but is not limited to Upper respiratory infection, lower respiratory infection, allergies/irritants, asthma, foreign body, medications (ACE inhibitors), reflux, CHF, interstitial lung disease. Vital signs show elevated blood pressure, febrile one 1.6, tachycardic, tachypneic, satting 92% on room air. Physical exam as noted above.   She received albuterol and Atrovent with EMS.  She does have a allergy listed for albuterol however she reports it just makes her feel shaky.  She actually has a prescription for an albuterol inhaler.  She reports she is okay with taking albuterol while she is here.  Will order her additional DuoNeb.  Additionally, I did call a code sepsis given patient's presentation and concern for pneumonia.  Patient has had some improvement with the DuoNeb with her tachypnea however she is now at 88% to 90%.  Will start her on 2 L nasal cannula.  Will continue with hour-long continuous nebulizer with albuterol.  Patient is nontachypneic, she still having some tachycardia with occasional PVCs.  She did not take her metoprolol or other medications today.  She reports that she is feeling much better.   She is moving air throughout her lungs better however still mildly tachycardic at 114.  Fever has improved with the Tylenol.  I independently reviewed and interpreted the patient's labs.  CBC without leukocytosis or anemia however patient does have a slightly increased neutrophil count at 8.6.  Normal APTT and PT/INR.  CMP shows decreased potassium of 2.9, glucose 155, calcium 8.5 otherwise unremarkable for any LFT or electrolyte abnormality.  BNP is elevated at 226.2.  I-STAT lactate within normal limits.  hCG negative.  For troponin at 39 with repeat at 31.  Patient is flu positive.  Chest x-ray shows pulmonary vascular congestion without overt edema. Per radiologist's interpretation.    EKG reviewed and interpreted by my attending and read as Sinus tachycardia Multiform ventricular premature complexes LAE, consider biatrial enlargement Left anterior fascicular block Abnormal R-wave progression, late transition LVH with secondary repolarization abnormality .  The patient's fluid status, did cancel the code sepsis.  She still received some fluids.  I have also started her on some oral potassium and IV potassium.  She was given Tylenol as well for the fever.  She was also given Solu-Medrol.  She now appears much more comfortable and has been resting on the stretcher requesting more apple juice.  Given patient's multiple comorbidities as well as her initial presentation I do feel like it is needed to admit her for the flu.  She is still on the 2 L however does appear to be feeling much better.  She still has some tachycardia.  Discussed possible admission with the patient and she is amenable to this.  I discussed this case with the [facility] hospitalist, Dr. Janalyn Shy.  She wanted me to contact cardiology given the patient's multiform PVCs.  I discussed this case with Dr. Orson Aloe.  He thinks that her PVC burden is likely worsening due to the flu.  Recommend she continue her home medication however does not  recommend any other additional treatments.  He reports if a formal consultation is needed to please let him or the day team know.  Portions of this report may have been transcribed using voice recognition software. Every effort was made to ensure accuracy; however, inadvertent computerized transcription errors may be present.   I discussed this case with my attending physician who cosigned this note including patient's presenting symptoms, physical  exam, and planned diagnostics and interventions. Attending physician stated agreement with plan or made changes to plan which were implemented.   Attending physician assessed patient at bedside.  Final Clinical Impression(s) / ED Diagnoses Final diagnoses:  Flu  Hypokalemia  Hypoxia    Rx / DC Orders ED Discharge Orders     None         Achille Rich, PA-C 08/25/23 8295    Maia Plan, MD 08/29/23 [phone]

## 2023-08-25 NOTE — H&P (Signed)
History and Physical    [name] ZOX:[id number] DOB: 1976-03-06 DOA: 08/24/2023  PCP: Ivonne Andrew, NP   Patient coming from: Home   Chief Complaint: Sinus congestion, cough, SOB, malaise, chest discomfort   HPI: Julia Ingram is a 48 y.o. female with medical history significant for asthma, hypertension, paroxysmal SVT, CAD, and vocal cord dysfunction who presents with nasal congestion, cough, shortness of breath, and chest discomfort.  Patient reports that her son has been sick with upper respiratory symptoms and she herself developed rhinorrhea and general malaise 3 or 4 days ago.  She has also been experiencing frequent cough that is nonproductive and worsening shortness of breath.  She tried using her inhalers at home with no appreciable improvement.  She has some vague chest discomfort that is now constant.  She reports history of leg swelling but does not believe they are swollen now.  ED Course: Upon arrival to the ED, patient is found to be febrile to 38.7 C and saturating low 90s on room air with tachypnea, tachycardia, and stable blood pressure.  Labs are most notable for normal creatinine, potassium 2.9, normal WBC, normal lactic acid, troponin 39, BNP 226, and positive influenza A PCR.  Blood cultures were collected and the patient was given acetaminophen, albuterol, DuoNeb, Solu-Medrol, and oral and IV potassium.  ED PA discussed the case with cardiology who recommended resuming the patient's metoprolol.  Review of Systems:  All other systems reviewed and apart from HPI, are negative.  Past Medical History:  Diagnosis Date   Asthma    CAD (coronary artery disease) 06/06/2019   Hypertension    MI (myocardial infarction) (HCC)    Pyloric stenosis    Vocal cord paralysis 06/06/2019    Past Surgical History:  Procedure Laterality Date   APPENDECTOMY     cervix     novashore   CHOLECYSTECTOMY     MEDIAL PARTIAL KNEE REPLACEMENT      Social History:   reports  that she has been smoking cigarettes. She has a 10 pack-year smoking history. She has never used smokeless tobacco. She reports that she does not drink alcohol and does not use drugs.  Allergies  Allergen Reactions   Demerol  [Meperidine Hcl] Hives   Magnesium Sulfate Anaphylaxis    Per patient on 05/16/15, said her heart stopped when she received this medication.   Meperidine Hives   Albuterol Nausea And Vomiting   Gadolinium Derivatives Hives and Nausea And Vomiting   Other Other (See Comments)    Macadamia nuts - SOB   Pirbuterol Other (See Comments)    Headache     Family History  Problem Relation Age of Onset   Allergies Mother    Asthma Mother    Asthma Father    Stomach cancer Maternal Grandmother      Prior to Admission medications   Medication Sig Start Date End Date Taking? Authorizing Provider  albuterol (PROVENTIL) (2.5 MG/3ML) 0.083% nebulizer solution Take 3 mLs (2.5 mg total) by nebulization every 6 (six) hours as needed for wheezing or shortness of breath. Patient not taking: Reported on 01/31/2022 11/17/21 11/17/22  Almon Hercules, MD  albuterol (VENTOLIN HFA) 108 (90 Base) MCG/ACT inhaler Inhale 2 puffs into the lungs every 6 (six) hours as needed for wheezing or shortness of breath. Patient not taking: Reported on 01/31/2022 06/08/19   Rodolph Bong, MD  amLODipine (NORVASC) 10 MG tablet Take 1 tablet (10 mg total) by mouth daily. Patient not taking: Reported  on 01/31/2022 11/17/21   Almon Hercules, MD  aspirin-acetaminophen-caffeine (EXCEDRIN MIGRAINE) [phone] MG tablet Take 2 tablets by mouth every 6 (six) hours as needed for headache.  Patient not taking: Reported on 01/31/2022    [provider]  azithromycin (ZITHROMAX Z-PAK) 250 MG tablet Take two tablets the first day, and one tablet daily for the next four days Patient not taking: Reported on 01/31/2022 12/04/21   Orion Crook I, NP  dextromethorphan-guaiFENesin (MUCINEX DM) 30-600 MG 12hr  tablet Take 1 tablet by mouth 2 (two) times daily. Patient not taking: Reported on 01/31/2022 12/04/21   Orion Crook I, NP  fluticasone (FLONASE) 50 MCG/ACT nasal spray Place 2 sprays into both nostrils daily. Patient not taking: Reported on 12/04/2021 06/08/19   Rodolph Bong, MD  loperamide (IMODIUM A-D) 2 MG tablet Take 1 tablet (2 mg total) by mouth 4 (four) times daily as needed for diarrhea or loose stools. 01/31/22   Ivonne Andrew, NP  losartan (COZAAR) 100 MG tablet Take 1 tablet (100 mg total) by mouth daily. Patient not taking: Reported on 01/31/2022 11/17/21   Almon Hercules, MD  metFORMIN (GLUCOPHAGE) 500 MG tablet Take 1 tablet (500 mg total) by mouth 2 (two) times daily with a meal. Patient not taking: Reported on 01/31/2022 12/05/21   Orion Crook I, NP  metoprolol succinate (TOPROL-XL) 25 MG 24 hr tablet Take 1 tablet (25 mg total) by mouth daily. 09/27/22 10/27/22  Nira Conn, MD  nicotine (NICODERM CQ - DOSED IN MG/24 HOURS) 14 mg/24hr patch Place 1 patch (14 mg total) onto the skin daily. Patient not taking: Reported on 01/31/2022 11/17/21   Almon Hercules, MD  Olopatadine HCl (PATADAY) 0.7 % SOLN Apply 1 drop to eye daily. Patient not taking: Reported on 01/31/2022 12/04/21   Orion Crook I, NP  pantoprazole (PROTONIX) 40 MG tablet Take 1 tablet (40 mg total) by mouth daily at 6 (six) AM. Patient not taking: Reported on 12/04/2021 11/17/21   Almon Hercules, MD  Polyethyl Glycol-Propyl Glycol (SYSTANE) 0.4-0.3 % SOLN Apply 2 drops to eye 2 (two) times daily as needed (dry eye). Patient not taking: Reported on 01/31/2022 12/04/21   Orion Crook I, NP  predniSONE (DELTASONE) 50 MG tablet Take 1 tablet (50 mg total) by mouth daily. Patient not taking: Reported on 01/31/2022 12/04/21   Orion Crook I, NP  SYMBICORT 160-4.5 MCG/ACT inhaler 2 puffs daily. Patient not taking: Reported on 01/31/2022 07/28/21   [provider]  umeclidinium bromide (INCRUSE ELLIPTA)  62.5 MCG/INH AEPB Inhale 1 puff into the lungs daily. Patient not taking: Reported on 12/04/2021 11/17/21   Almon Hercules, MD    Physical Exam: Vitals:   08/24/23 2209 08/25/23 0015 08/25/23 0100 08/25/23 0200  BP: (!) 165/113 113/68  137/66  Pulse: (!) 132 (!) 114  (!) 114  Resp: (!) 30 (!) 33  18  Temp: (!) 101.6 F (38.7 C)   99.2 F (37.3 C)  TempSrc: Oral   Oral  SpO2: 92% 90% 96% 100%     Constitutional: NAD, no pallor or diaphoresis   Eyes: PERTLA, lids and conjunctivae normal ENMT: Mucous membranes are moist. Posterior pharynx clear of any exudate or lesions.   Neck: supple, no masses  Respiratory: Tachypneic. No wheezing. Able to speak full sentences.   Cardiovascular: S1 & S2 heard, regular rate and rhythm. Mmild lower extremity edema.  Abdomen: No distension, no tenderness, soft. Bowel sounds active.  Musculoskeletal: no clubbing /  cyanosis. No joint deformity upper and lower extremities.   Skin: no significant rashes, lesions, ulcers. Warm, dry, well-perfused. Neurologic: CN 2-12 grossly intact. Moving all extremities. Sleeping, wakes to voice and is fully oriented.  Psychiatric: Calm. Cooperative.    Labs and Imaging on Admission: I have personally reviewed following labs and imaging studies  CBC: Recent Labs  Lab 08/25/23 0135  WBC 10.0  NEUTROABS 8.6*  HGB 13.0  HCT 40.2  MCV 91.8  PLT 294   Basic Metabolic Panel: Recent Labs  Lab 08/25/23 0135  NA 138  K 2.9*  CL 103  CO2 26  GLUCOSE 155*  BUN 13  CREATININE 0.84  CALCIUM 8.5*   GFR: CrCl cannot be calculated (Unknown ideal weight.). Liver Function Tests: Recent Labs  Lab 08/25/23 0135  AST 21  ALT 15  ALKPHOS 103  BILITOT 0.6  PROT 7.8  ALBUMIN 3.5   No results for input(s): "LIPASE", "AMYLASE" in the last 168 hours. No results for input(s): "AMMONIA" in the last 168 hours. Coagulation Profile: Recent Labs  Lab 08/24/23 0002  INR 1.1   Cardiac Enzymes: No results for  input(s): "CKTOTAL", "CKMB", "CKMBINDEX", "TROPONINI" in the last 168 hours. BNP (last 3 results) No results for input(s): "PROBNP" in the last 8760 hours. HbA1C: No results for input(s): "HGBA1C" in the last 72 hours. CBG: No results for input(s): "GLUCAP" in the last 168 hours. Lipid Profile: No results for input(s): "CHOL", "HDL", "LDLCALC", "TRIG", "CHOLHDL", "LDLDIRECT" in the last 72 hours. Thyroid Function Tests: No results for input(s): "TSH", "T4TOTAL", "FREET4", "T3FREE", "THYROIDAB" in the last 72 hours. Anemia Panel: No results for input(s): "VITAMINB12", "FOLATE", "FERRITIN", "TIBC", "IRON", "RETICCTPCT" in the last 72 hours. Urine analysis:    Component Value Date/Time   BILIRUBINUR negative 01/31/2022 1537   KETONESUR negative 01/31/2022 1537   UROBILINOGEN 1.0 01/31/2022 1537   NITRITE Negative 01/31/2022 1537   LEUKOCYTESUR Negative 01/31/2022 1537   Sepsis Labs: @LABRCNTIP (procalcitonin:4,lacticidven:4) ) Recent Results (from the past 240 hours)  Resp panel by RT-PCR (RSV, Flu A&B, Covid) Anterior Nasal Swab     Status: Abnormal   Collection Time: 08/24/23 10:35 PM   Specimen: Anterior Nasal Swab  Result Value Ref Range Status   SARS Coronavirus 2 by RT PCR NEGATIVE NEGATIVE Final    Comment: (NOTE) SARS-CoV-2 target nucleic acids are NOT DETECTED.  The SARS-CoV-2 RNA is generally detectable in upper respiratory specimens during the acute phase of infection. The lowest concentration of SARS-CoV-2 viral copies this assay can detect is 138 copies/mL. A negative result does not preclude SARS-Cov-2 infection and should not be used as the sole basis for treatment or other patient management decisions. A negative result may occur with  improper specimen collection/handling, submission of specimen other than nasopharyngeal swab, presence of viral mutation(s) within the areas targeted by this assay, and inadequate number of viral copies(<138 copies/mL). A negative  result must be combined with clinical observations, patient history, and epidemiological information. The expected result is Negative.  Fact Sheet for Patients:  BloggerCourse.com  Fact Sheet for Healthcare Providers:  SeriousBroker.it  This test is no t yet approved or cleared by the [place] FDA and  has been authorized for detection and/or diagnosis of SARS-CoV-2 by FDA under an Emergency Use Authorization (EUA). This EUA will remain  in effect (meaning this test can be used) for the duration of the COVID-19 declaration under Section 564(b)(1) of the Act, 21 U.S.C.section 360bbb-3(b)(1), unless the authorization is terminated  or revoked  sooner.       Influenza A by PCR POSITIVE (A) NEGATIVE Final   Influenza B by PCR NEGATIVE NEGATIVE Final    Comment: (NOTE) The Xpert Xpress SARS-CoV-2/FLU/RSV plus assay is intended as an aid in the diagnosis of influenza from Nasopharyngeal swab specimens and should not be used as a sole basis for treatment. Nasal washings and aspirates are unacceptable for Xpert Xpress SARS-CoV-2/FLU/RSV testing.  Fact Sheet for Patients: BloggerCourse.com  Fact Sheet for Healthcare Providers: SeriousBroker.it  This test is not yet approved or cleared by the [place] FDA and has been authorized for detection and/or diagnosis of SARS-CoV-2 by FDA under an Emergency Use Authorization (EUA). This EUA will remain in effect (meaning this test can be used) for the duration of the COVID-19 declaration under Section 564(b)(1) of the Act, 21 U.S.C. section 360bbb-3(b)(1), unless the authorization is terminated or revoked.     Resp Syncytial Virus by PCR NEGATIVE NEGATIVE Final    Comment: (NOTE) Fact Sheet for Patients: BloggerCourse.com  Fact Sheet for Healthcare  Providers: SeriousBroker.it  This test is not yet approved or cleared by the [place] FDA and has been authorized for detection and/or diagnosis of SARS-CoV-2 by FDA under an Emergency Use Authorization (EUA). This EUA will remain in effect (meaning this test can be used) for the duration of the COVID-19 declaration under Section 564(b)(1) of the Act, 21 U.S.C. section 360bbb-3(b)(1), unless the authorization is terminated or revoked.  Performed at [facility], [address], [place], [place] [address]   Blood Culture (routine x 2)     Status: None (Preliminary result)   Collection Time: 08/25/23  1:36 AM   Specimen: BLOOD RIGHT FOREARM  Result Value Ref Range Status   Specimen Description   Final    BLOOD RIGHT FOREARM Performed at [facility], [address] [place], [place] [address]    Special Requests   Final    BOTTLES DRAWN AEROBIC AND ANAEROBIC Blood Culture results may not be optimal due to an inadequate volume of blood received in culture bottles Performed at [facility], [address], [place], [place] [address]    Culture PENDING  Incomplete   Report Status PENDING  Incomplete     Radiological Exams on Admission: DG Chest Port 1 View Result Date: 08/24/2023 CLINICAL DATA:  Short of breath, respiratory distress, sepsis EXAM: PORTABLE CHEST 1 VIEW COMPARISON:  09/27/2022 FINDINGS: Single frontal view of the chest demonstrates an enlarged cardiac silhouette. There is increased central vascular prominence, without acute airspace disease, effusion, or pneumothorax. No acute bony abnormalities. IMPRESSION: 1. Pulmonary vascular congestion without overt edema. Electronically Signed   By: Sharlet Salina M.D.   On: 08/24/2023 23:34    EKG: Independently reviewed. Sinus tachycardia, rate 135, PVCs, LAFB, LVH with repolarization abnormality.   Assessment/Plan   1. Influenza A  - Tamiflu, droplet  precautions, supportive care, monitor for complications    2. Chest pain  - Troponin mildly elevated and decreasing, not suggestive of ACS  - Repeat EKG, check D-dimer, resume beta-blocker per cardiology recommendations    3. Hypokalemia  - Replacing   4. Asthma  - Not in exacerbation  - Continue ICS-LAMA and LABA, as-needed SABA   5. Paroxysmal SVT  - Resume Toprol    DVT prophylaxis: Lovenox  Code Status: Full  Level of Care: Level of care: Telemetry Family Communication: None present   Disposition Plan:  Patient is from: Home Anticipated d/c  is to: Home  Anticipated d/c date is: 08/27/23  Patient currently: Pending improved respiratory status  Consults called: None  Admission status: Inpatient     Briscoe Deutscher, MD [facility]  08/25/2023, 5:33 AM

## 2023-08-26 DIAGNOSIS — J101 Influenza due to other identified influenza virus with other respiratory manifestations: Secondary | ICD-10-CM | POA: Diagnosis not present

## 2023-08-26 LAB — BASIC METABOLIC PANEL
Anion gap: 9 (ref 5–15)
BUN: 16 mg/dL (ref 6–20)
CO2: 29 mmol/L (ref 22–32)
Calcium: 8.6 mg/dL — ABNORMAL LOW (ref 8.9–10.3)
Chloride: 102 mmol/L (ref 98–111)
Creatinine, Ser: 0.56 mg/dL (ref 0.44–1.00)
GFR, Estimated: >60 mL/min (ref >60)
Glucose, Bld: 137 mg/dL — ABNORMAL HIGH (ref 70–99)
Potassium: 3.5 mmol/L (ref 3.5–5.1)
Sodium: 140 mmol/L (ref 135–145)

## 2023-08-26 LAB — HEMOGLOBIN A1C
Hgb A1c MFr Bld: 6.5 % — ABNORMAL HIGH (ref 4.8–5.6)
Mean Plasma Glucose: 139.85 mg/dL

## 2023-08-26 LAB — GLUCOSE, CAPILLARY: Glucose-Capillary: 106 mg/dL — ABNORMAL HIGH (ref 70–99)

## 2023-08-26 LAB — CBC
HCT: 39 % (ref 36.0–46.0)
Hemoglobin: 12.6 g/dL (ref 12.0–15.0)
MCH: 29.6 pg (ref 26.0–34.0)
MCHC: 32.3 g/dL (ref 30.0–36.0)
MCV: 91.8 fL (ref 80.0–100.0)
Platelets: 365 10*3/uL (ref 150–400)
RBC: 4.25 MIL/uL (ref 3.87–5.11)
RDW: 13.5 % (ref 11.5–15.5)
WBC: 12.7 10*3/uL — ABNORMAL HIGH (ref 4.0–10.5)
nRBC: 0 % (ref 0.0–0.2)

## 2023-08-26 LAB — MAGNESIUM: Magnesium: 2.4 mg/dL (ref 1.7–2.4)

## 2023-08-26 LAB — HIV ANTIBODY (ROUTINE TESTING W REFLEX): HIV Screen 4th Generation wRfx: NONREACTIVE

## 2023-08-26 LAB — PHOSPHORUS: Phosphorus: 3.2 mg/dL (ref 2.5–4.6)

## 2023-08-26 MED ORDER — OSELTAMIVIR PHOSPHATE 75 MG PO CAPS: 75.0000 mg | ORAL_CAPSULE | Freq: Two times a day (BID) | ORAL | 0 refills | Status: AC

## 2023-08-26 MED ORDER — ALBUTEROL SULFATE HFA 108 (90 BASE) MCG/ACT IN AERS
2.0000 | INHALATION_SPRAY | Freq: Four times a day (QID) | RESPIRATORY_TRACT | 1 refills | Status: AC | PRN
Start: 2023-08-26 — End: ?

## 2023-08-26 MED ORDER — ALBUTEROL SULFATE (2.5 MG/3ML) 0.083% IN NEBU: 2.5000 mg | INHALATION_SOLUTION | Freq: Four times a day (QID) | RESPIRATORY_TRACT | 2 refills | Status: AC | PRN

## 2023-08-26 MED ORDER — POTASSIUM CHLORIDE CRYS ER 20 MEQ PO TBCR
40.0000 meq | EXTENDED_RELEASE_TABLET | Freq: Once | ORAL | Status: AC
  Administered 2023-08-26: 40 meq via ORAL
  Filled 2023-08-26: qty 2

## 2023-08-26 NOTE — Discharge Instructions (Signed)
Food Resources Agency Name: [employer] Address: [address] [place], [place] [address] Phone: [phone] Website: LocalChronicle.com.cy. Service(s) Offered: Provides grocery assistance to income eligible  individuals and their families. Hours are  Monday - Friday 10am-2pm by appointment only Hours may vary. Agency Name: [facility] Address: [address] [place]. [place], [place] [address] Phone: [phone] Website: [email or web address].[email or web address]/ Service(s) Offered: food assistance hours are from 9:30am to 4:30pm Call [phone] or [phone] for arrangements. October 18, 2021 12 Agency Name: [facility] Address: [phone] [place], [place] Phone: [phone] Website: [email or web address]://[email or web address].[email or web address] Service(s) Offered: Food assistance hours are Tues & Thurs 9am12pm. Please bring your driver's license and  social security cards for all members of your  household. Agency Name: [place] [facility] Listing: Additional Resources: https:www.foodpantries.org/ci/[place]   Utilities:  [employer], call our utility hotline, 2522891770 ext. 314 for updated information.  (As soon as the system answers, you may enter the extension 314 and bypass the prompts.)

## 2023-08-26 NOTE — Progress Notes (Signed)
PT Cancellation Note  Patient Details Name: Julia Ingram MRN: [id number] DOB: Aug 15, 1975   Cancelled Treatment:    Reason Eval/Treat Not Completed: PT screened, no needs identified, will sign off. Thank you for this referral.   Johnny Bridge, PT Acute Rehab   Jacqualyn Posey 08/26/2023, 12:32 PM

## 2023-08-26 NOTE — Discharge Summary (Signed)
Physician Discharge Summary  Julia Ingram VOZ:[id number] DOB: 09/10/1975 DOA: 08/24/2023  PCP: Ivonne Andrew, NP  Admit date: 08/24/2023 Discharge date: 08/26/2023  Admitted From: Home Disposition: Home  Recommendations for Outpatient Follow-up:  Follow up with PCP in 1-2 weeks Please obtain BMP/CBC in one week your next doctors visit.  Albuterol, Tamiflu prescribed  Discharge Condition: Stable CODE STATUS: Full code Diet recommendation: Heart healthy  Brief/Interim Summary: Brief Narrative:  48 year old with history of asthma, HTN, paroxysmal SVT, CAD, vocal cord dysfunction presents with nasal congestion shortness of breath and chest discomfort.  Patient reporting of URI type of symptoms along with malaise for 3-4 days.  Upon admission diagnosed with influenza A started on Tamiflu During the course of hospitalization over 48 hours she significantly improved in terms of her respiration, chest pain and shortness of breath.  Today we will discharge her in stable condition.  Assessment & Plan:  Principal Problem:   Influenza A Active Problems:   Hypokalemia   Persistent asthma without complication   CAD (coronary artery disease)   Type 2 diabetes mellitus without complication, without long-term current use of insulin (HCC)   Chronic heart failure with preserved ejection fraction (HFpEF) (HCC)   Paroxysmal SVT (supraventricular tachycardia) (HCC)   Chest pain    Influenza A  She is doing significantly well, will prescribe Tamiflu to complete her course.  Will also refill her bronchodilators/albuterol inhaler.   Atypical chest pain, resolved Troponins are around 30 and flat trending down.  Likely non-ACS related, EKG is nonischemic.  D-dimer 0.99.   Hypokalemia Replete as needed.   History of asthma Continue home bronchodilators   Paroxysmal SVT On Toprol.    Essential hypertension - She is on home losartan and Toprol.  History of CAD - Continue home  Plavix.  Diabetes mellitus type 2 - Patient is adamant that she is not any medications at home.  Her A1c 6.5.    DVT prophylaxis: enoxaparin (LOVENOX) injection 40 mg Start: 08/25/23 1000    Code Status: Full Code Family Communication:   Status is: Inpatient DC today   Subjective: Patient is doing significantly well and wanting to go home.  No other complaints.   Examination:  General exam: Appears calm and comfortable  Respiratory system: Clear to auscultation. Respiratory effort normal. Cardiovascular system: S1 & S2 heard, RRR. No JVD, murmurs, rubs, gallops or clicks. No pedal edema. Gastrointestinal system: Abdomen is nondistended, soft and nontender. No organomegaly or masses felt. Normal bowel sounds heard. Central nervous system: Alert and oriented. No focal neurological deficits. Extremities: Symmetric 5 x 5 power. Skin: No rashes, lesions or ulcers Psychiatry: Judgement and insight appear normal. Mood & affect appropriate.    Discharge Diagnoses:  Principal Problem:   Influenza A Active Problems:   Hypokalemia   Persistent asthma without complication   CAD (coronary artery disease)   Type 2 diabetes mellitus without complication, without long-term current use of insulin (HCC)   Chronic heart failure with preserved ejection fraction (HFpEF) (HCC)   Paroxysmal SVT (supraventricular tachycardia) (HCC)   Chest pain      Discharge Exam: Vitals:   08/25/23 2005 08/26/23 0605  BP: (!) 168/95 (!) 158/98  Pulse: 91 80  Resp: 18 18  Temp: 98.3 F (36.8 C) 98 F (36.7 C)  SpO2: 96% (!) 89%   Vitals:   08/25/23 1556 08/25/23 2005 08/25/23 2005 08/26/23 0605  BP: (!) 150/88 (!) 168/95 (!) 168/95 (!) 158/98  Pulse: 94 91 91 80  Resp:  20 20 18 18   Temp: 98.6 F (37 C) 98.3 F (36.8 C) 98.3 F (36.8 C) 98 F (36.7 C)  TempSrc: Oral Oral    SpO2: 90% 96% 96% (!) 89%  Weight:      Height:          Discharge Instructions   Allergies as of 08/26/2023        Reactions   Demerol  [meperidine Hcl] Hives   Magnesium Sulfate Anaphylaxis   Per patient on 05/16/15, said her heart stopped when she received this medication.   Meperidine Hives   Albuterol Nausea And Vomiting   Gadolinium Derivatives Hives, Nausea And Vomiting   Other Other (See Comments)   Macadamia nuts - SOB   Pirbuterol Other (See Comments)   Headache   Pneumovax [pneumococcal Polysaccharide Vaccine] Swelling   Swelling in neck        Medication List     STOP taking these medications    amLODipine 10 MG tablet Commonly known as: NORVASC   aspirin 81 MG chewable tablet   metFORMIN 500 MG tablet Commonly known as: GLUCOPHAGE       TAKE these medications    albuterol (2.5 MG/3ML) 0.083% nebulizer solution Commonly known as: PROVENTIL Take 3 mLs (2.5 mg total) by nebulization every 6 (six) hours as needed for wheezing or shortness of breath.   albuterol 108 (90 Base) MCG/ACT inhaler Commonly known as: VENTOLIN HFA Inhale 2 puffs into the lungs every 6 (six) hours as needed for wheezing or shortness of breath.   aspirin-acetaminophen-caffeine 250-250-65 MG tablet Commonly known as: EXCEDRIN MIGRAINE Take 2 tablets by mouth every 6 (six) hours as needed for headache.   celecoxib 200 MG capsule Commonly known as: CELEBREX Take 200 mg by mouth 2 (two) times daily.   clopidogrel 75 MG tablet Commonly known as: PLAVIX Take 75 mg by mouth daily.   dextromethorphan-guaiFENesin 30-600 MG 12hr tablet Commonly known as: MUCINEX DM Take 1 tablet by mouth 2 (two) times daily.   losartan 100 MG tablet Commonly known as: COZAAR Take 1 tablet (100 mg total) by mouth daily.   metoprolol succinate 25 MG 24 hr tablet Commonly known as: TOPROL-XL Take 1 tablet (25 mg total) by mouth daily.   nystatin ointment Commonly known as: MYCOSTATIN Apply 1 Application topically 2 (two) times daily.   oseltamivir 75 MG capsule Commonly known as: TAMIFLU Take 1  capsule (75 mg total) by mouth 2 (two) times daily for 7 doses.   Restasis 0.05 % ophthalmic emulsion Generic drug: cycloSPORINE Place 1 drop into both eyes 2 (two) times daily.   rosuvastatin 10 MG tablet Commonly known as: CRESTOR Take 10 mg by mouth daily.        Follow-up Information     Ivonne Andrew, NP Follow up in 1 week(s).   Specialties: Pulmonary Disease, Endocrinology Contact information: [address] [place] [address] [phone]                Allergies  Allergen Reactions   Demerol  [Meperidine Hcl] Hives   Magnesium Sulfate Anaphylaxis    Per patient on 05/16/15, said her heart stopped when she received this medication.   Meperidine Hives   Albuterol Nausea And Vomiting   Gadolinium Derivatives Hives and Nausea And Vomiting   Other Other (See Comments)    Macadamia nuts - SOB   Pirbuterol Other (See Comments)    Headache    Pneumovax [Pneumococcal Polysaccharide Vaccine] Swelling  Swelling in neck    You were cared for by a hospitalist during your hospital stay. If you have any questions about your discharge medications or the care you received while you were in the hospital after you are discharged, you can call the unit and asked to speak with the hospitalist on call if the hospitalist that took care of you is not available. Once you are discharged, your primary care physician will handle any further medical issues. Please note that no refills for any discharge medications will be authorized once you are discharged, as it is imperative that you return to your primary care physician (or establish a relationship with a primary care physician if you do not have one) for your aftercare needs so that they can reassess your need for medications and monitor your lab values.  You were cared for by a hospitalist during your hospital stay. If you have any questions about your discharge medications or the care you received while you were  in the hospital after you are discharged, you can call the unit and asked to speak with the hospitalist on call if the hospitalist that took care of you is not available. Once you are discharged, your primary care physician will handle any further medical issues. Please note that NO REFILLS for any discharge medications will be authorized once you are discharged, as it is imperative that you return to your primary care physician (or establish a relationship with a primary care physician if you do not have one) for your aftercare needs so that they can reassess your need for medications and monitor your lab values.  Please request your Prim.MD to go over all Hospital Tests and Procedure/Radiological results at the follow up, please get all Hospital records sent to your Prim MD by signing hospital release before you go home.  Get CBC, CMP, 2 view Chest X ray checked  by Primary MD during your next visit or SNF MD in 5-7 days ( we routinely change or add medications that can affect your baseline labs and fluid status, therefore we recommend that you get the mentioned basic workup next visit with your PCP, your PCP may decide not to get them or add new tests based on their clinical decision)  On your next visit with your primary care physician please Get Medicines reviewed and adjusted.  If you experience worsening of your admission symptoms, develop shortness of breath, life threatening emergency, suicidal or homicidal thoughts you must seek medical attention immediately by calling 911 or calling your MD immediately  if symptoms less severe.  You Must read complete instructions/literature along with all the possible adverse reactions/side effects for all the Medicines you take and that have been prescribed to you. Take any new Medicines after you have completely understood and accpet all the possible adverse reactions/side effects.   Do not drive, operate heavy machinery, perform activities at heights,  swimming or participation in water activities or provide baby sitting services if your were admitted for syncope or siezures until you have seen by Primary MD or a Neurologist and advised to do so again.  Do not drive when taking Pain medications.   Procedures/Studies: DG Chest Port 1 View Result Date: 08/24/2023 CLINICAL DATA:  Short of breath, respiratory distress, sepsis EXAM: PORTABLE CHEST 1 VIEW COMPARISON:  09/27/2022 FINDINGS: Single frontal view of the chest demonstrates an enlarged cardiac silhouette. There is increased central vascular prominence, without acute airspace disease, effusion, or pneumothorax. No acute bony abnormalities. IMPRESSION: 1.  Pulmonary vascular congestion without overt edema. Electronically Signed   By: Sharlet Salina M.D.   On: 08/24/2023 23:34     The results of significant diagnostics from this hospitalization (including imaging, microbiology, ancillary and laboratory) are listed below for reference.     Microbiology: Recent Results (from the past 240 hours)  Resp panel by RT-PCR (RSV, Flu A&B, Covid) Anterior Nasal Swab     Status: Abnormal   Collection Time: 08/24/23 10:35 PM   Specimen: Anterior Nasal Swab  Result Value Ref Range Status   SARS Coronavirus 2 by RT PCR NEGATIVE NEGATIVE Final    Comment: (NOTE) SARS-CoV-2 target nucleic acids are NOT DETECTED.  The SARS-CoV-2 RNA is generally detectable in upper respiratory specimens during the acute phase of infection. The lowest concentration of SARS-CoV-2 viral copies this assay can detect is 138 copies/mL. A negative result does not preclude SARS-Cov-2 infection and should not be used as the sole basis for treatment or other patient management decisions. A negative result may occur with  improper specimen collection/handling, submission of specimen other than nasopharyngeal swab, presence of viral mutation(s) within the areas targeted by this assay, and inadequate number of viral copies(<138  copies/mL). A negative result must be combined with clinical observations, patient history, and epidemiological information. The expected result is Negative.  Fact Sheet for Patients:  BloggerCourse.com  Fact Sheet for Healthcare Providers:  SeriousBroker.it  This test is no t yet approved or cleared by the [place] FDA and  has been authorized for detection and/or diagnosis of SARS-CoV-2 by FDA under an Emergency Use Authorization (EUA). This EUA will remain  in effect (meaning this test can be used) for the duration of the COVID-19 declaration under Section 564(b)(1) of the Act, 21 U.S.C.section 360bbb-3(b)(1), unless the authorization is terminated  or revoked sooner.       Influenza A by PCR POSITIVE (A) NEGATIVE Final   Influenza B by PCR NEGATIVE NEGATIVE Final    Comment: (NOTE) The Xpert Xpress SARS-CoV-2/FLU/RSV plus assay is intended as an aid in the diagnosis of influenza from Nasopharyngeal swab specimens and should not be used as a sole basis for treatment. Nasal washings and aspirates are unacceptable for Xpert Xpress SARS-CoV-2/FLU/RSV testing.  Fact Sheet for Patients: BloggerCourse.com  Fact Sheet for Healthcare Providers: SeriousBroker.it  This test is not yet approved or cleared by the [place] FDA and has been authorized for detection and/or diagnosis of SARS-CoV-2 by FDA under an Emergency Use Authorization (EUA). This EUA will remain in effect (meaning this test can be used) for the duration of the COVID-19 declaration under Section 564(b)(1) of the Act, 21 U.S.C. section 360bbb-3(b)(1), unless the authorization is terminated or revoked.     Resp Syncytial Virus by PCR NEGATIVE NEGATIVE Final    Comment: (NOTE) Fact Sheet for Patients: BloggerCourse.com  Fact Sheet for Healthcare  Providers: SeriousBroker.it  This test is not yet approved or cleared by the [place] FDA and has been authorized for detection and/or diagnosis of SARS-CoV-2 by FDA under an Emergency Use Authorization (EUA). This EUA will remain in effect (meaning this test can be used) for the duration of the COVID-19 declaration under Section 564(b)(1) of the Act, 21 U.S.C. section 360bbb-3(b)(1), unless the authorization is terminated or revoked.  Performed at [facility], [address], [place], [place] [address]   Blood Culture (routine x 2)     Status: None (Preliminary result)   Collection Time: 08/25/23  1:36 AM  Specimen: BLOOD RIGHT FOREARM  Result Value Ref Range Status   Specimen Description   Final    BLOOD RIGHT FOREARM Performed at [facility], [address] [place], [place] [address]    Special Requests   Final    BOTTLES DRAWN AEROBIC AND ANAEROBIC Blood Culture results may not be optimal due to an inadequate volume of blood received in culture bottles Performed at [facility], [address], [place], [place] [address]    Culture   Final    NO GROWTH 1 DAY Performed at [facility], [address], [place], [place] [address]    Report Status PENDING  Incomplete  Blood Culture (routine x 2)     Status: None (Preliminary result)   Collection Time: 08/25/23 10:31 AM   Specimen: BLOOD RIGHT ARM  Result Value Ref Range Status   Specimen Description   Final    BLOOD RIGHT ARM Performed at [facility], [address] [place], [place] [address]    Special Requests   Final    BOTTLES DRAWN AEROBIC AND ANAEROBIC Blood Culture results may not be optimal due to an inadequate volume of blood received in culture bottles Performed at [facility], [address], [place], [place] [address]    Culture   Final    NO GROWTH < 24 HOURS Performed at [facility],  [address], [place], [place] [address]    Report Status PENDING  Incomplete     Labs: BNP (last 3 results) Recent Labs    09/27/22 0200 08/25/23 0135  BNP 28.0 226.2*   Basic Metabolic Panel: Recent Labs  Lab 08/25/23 0135 08/25/23 0619 08/25/23 1030 08/26/23 0431  NA 138  --  140 140  K 2.9*  --  3.3* 3.5  CL 103  --  102 102  CO2 26  --  23 29  GLUCOSE 155*  --  239* 137*  BUN 13  --  13 16  CREATININE 0.84  --  0.57 0.56  CALCIUM 8.5*  --  9.0 8.6*  MG  --  1.7 2.0 2.4  PHOS  --   --  2.6 3.2   Liver Function Tests: Recent Labs  Lab 08/25/23 0135  AST 21  ALT 15  ALKPHOS 103  BILITOT 0.6  PROT 7.8  ALBUMIN 3.5   No results for input(s): "LIPASE", "AMYLASE" in the last 168 hours. No results for input(s): "AMMONIA" in the last 168 hours. CBC: Recent Labs  Lab 08/25/23 0135 08/25/23 1030 08/26/23 0431  WBC 10.0 8.1 12.7*  NEUTROABS 8.6*  --   --   HGB 13.0 12.7 12.6  HCT 40.2 40.1 39.0  MCV 91.8 92.8 91.8  PLT 294 358 365   Cardiac Enzymes: No results for input(s): "CKTOTAL", "CKMB", "CKMBINDEX", "TROPONINI" in the last 168 hours. BNP: Invalid input(s): "POCBNP" CBG: Recent Labs  Lab 08/25/23 0810 08/25/23 1126 08/25/23 1705 08/25/23 1959 08/26/23 0748  GLUCAP 211* 219* 166* 145* 106*   D-Dimer Recent Labs    08/25/23 0619  DDIMER 0.99*   Hgb A1c Recent Labs    08/26/23 0431  HGBA1C 6.5*   Lipid Profile No results for input(s): "CHOL", "HDL", "LDLCALC", "TRIG", "CHOLHDL", "LDLDIRECT" in the last 72 hours. Thyroid function studies No results for input(s): "TSH", "T4TOTAL", "T3FREE", "THYROIDAB" in the last 72 hours.  Invalid input(s): "FREET3" Anemia work up No results for input(s): "VITAMINB12", "FOLATE", "FERRITIN", "TIBC", "IRON", "  RETICCTPCT" in the last 72 hours. Urinalysis    Component Value Date/Time   BILIRUBINUR negative 01/31/2022 1537   KETONESUR negative 01/31/2022 1537   UROBILINOGEN 1.0 01/31/2022 1537    NITRITE Negative 01/31/2022 1537   LEUKOCYTESUR Negative 01/31/2022 1537   Sepsis Labs Recent Labs  Lab 08/25/23 0135 08/25/23 1030 08/26/23 0431  WBC 10.0 8.1 12.7*   Microbiology Recent Results (from the past 240 hours)  Resp panel by RT-PCR (RSV, Flu A&B, Covid) Anterior Nasal Swab     Status: Abnormal   Collection Time: 08/24/23 10:35 PM   Specimen: Anterior Nasal Swab  Result Value Ref Range Status   SARS Coronavirus 2 by RT PCR NEGATIVE NEGATIVE Final    Comment: (NOTE) SARS-CoV-2 target nucleic acids are NOT DETECTED.  The SARS-CoV-2 RNA is generally detectable in upper respiratory specimens during the acute phase of infection. The lowest concentration of SARS-CoV-2 viral copies this assay can detect is 138 copies/mL. A negative result does not preclude SARS-Cov-2 infection and should not be used as the sole basis for treatment or other patient management decisions. A negative result may occur with  improper specimen collection/handling, submission of specimen other than nasopharyngeal swab, presence of viral mutation(s) within the areas targeted by this assay, and inadequate number of viral copies(<138 copies/mL). A negative result must be combined with clinical observations, patient history, and epidemiological information. The expected result is Negative.  Fact Sheet for Patients:  BloggerCourse.com  Fact Sheet for Healthcare Providers:  SeriousBroker.it  This test is no t yet approved or cleared by the [place] FDA and  has been authorized for detection and/or diagnosis of SARS-CoV-2 by FDA under an Emergency Use Authorization (EUA). This EUA will remain  in effect (meaning this test can be used) for the duration of the COVID-19 declaration under Section 564(b)(1) of the Act, 21 U.S.C.section 360bbb-3(b)(1), unless the authorization is terminated  or revoked sooner.       Influenza A by PCR POSITIVE (A)  NEGATIVE Final   Influenza B by PCR NEGATIVE NEGATIVE Final    Comment: (NOTE) The Xpert Xpress SARS-CoV-2/FLU/RSV plus assay is intended as an aid in the diagnosis of influenza from Nasopharyngeal swab specimens and should not be used as a sole basis for treatment. Nasal washings and aspirates are unacceptable for Xpert Xpress SARS-CoV-2/FLU/RSV testing.  Fact Sheet for Patients: BloggerCourse.com  Fact Sheet for Healthcare Providers: SeriousBroker.it  This test is not yet approved or cleared by the [place] FDA and has been authorized for detection and/or diagnosis of SARS-CoV-2 by FDA under an Emergency Use Authorization (EUA). This EUA will remain in effect (meaning this test can be used) for the duration of the COVID-19 declaration under Section 564(b)(1) of the Act, 21 U.S.C. section 360bbb-3(b)(1), unless the authorization is terminated or revoked.     Resp Syncytial Virus by PCR NEGATIVE NEGATIVE Final    Comment: (NOTE) Fact Sheet for Patients: BloggerCourse.com  Fact Sheet for Healthcare Providers: SeriousBroker.it  This test is not yet approved or cleared by the [place] FDA and has been authorized for detection and/or diagnosis of SARS-CoV-2 by FDA under an Emergency Use Authorization (EUA). This EUA will remain in effect (meaning this test can be used) for the duration of the COVID-19 declaration under Section 564(b)(1) of the Act, 21 U.S.C. section 360bbb-3(b)(1), unless the authorization is terminated or revoked.  Performed at [facility], [address] [place], [place] [address]   Blood Culture (routine x 2)  Status: None (Preliminary result)   Collection Time: 08/25/23  1:36 AM   Specimen: BLOOD RIGHT FOREARM  Result Value Ref Range Status   Specimen Description   Final    BLOOD RIGHT FOREARM Performed at [facility], [address], [place], [place] [address]    Special Requests   Final    BOTTLES DRAWN AEROBIC AND ANAEROBIC Blood Culture results may not be optimal due to an inadequate volume of blood received in culture bottles Performed at [facility], [address], [place], [place] [address]    Culture   Final    NO GROWTH 1 DAY Performed at [facility], [address] [place], [place] [address]    Report Status PENDING  Incomplete  Blood Culture (routine x 2)     Status: None (Preliminary result)   Collection Time: 08/25/23 10:31 AM   Specimen: BLOOD RIGHT ARM  Result Value Ref Range Status   Specimen Description   Final    BLOOD RIGHT ARM Performed at [facility], [address] [place], [place] [address]    Special Requests   Final    BOTTLES DRAWN AEROBIC AND ANAEROBIC Blood Culture results may not be optimal due to an inadequate volume of blood received in culture bottles Performed at [facility], [address], [place], [place] [address]    Culture   Final    NO GROWTH < 24 HOURS Performed at [facility], [address] [place], [place] [address]    Report Status PENDING  Incomplete     Time coordinating discharge:  I have spent 35 minutes face to face with the patient and on the ward discussing the patients care, assessment, plan and disposition with other care givers. >50% of the time was devoted counseling the patient about the risks and benefits of treatment/Discharge disposition and coordinating care.   SIGNED:   Miguel Rota, MD  [facility] 08/26/2023, 12:07 PM   If 7PM-7AM, please contact night-coverage

## 2023-08-26 NOTE — Progress Notes (Signed)
AVS and discharge instructions reviewed w/ patient. Patient verbalized understanding and set up transportation for self to home.

## 2023-08-26 NOTE — Progress Notes (Signed)
   08/26/23 0955  TOC Brief Assessment  Insurance and Status Reviewed  Patient has primary care physician Yes  Home environment has been reviewed from home  Prior level of function: independent  Prior/Current Home Services No current home services  Social Drivers of Health Review SDOH reviewed interventions complete  Readmission risk has been reviewed Yes  Transition of care needs no transition of care needs at this time    Reviewed pt's record. Resources added to pt's AVS to address SDOH concerns. No other TOC needs identified at this time.

## 2023-08-26 NOTE — Plan of Care (Signed)
  Problem: Education: Goal: Ability to describe self-care measures that may prevent or decrease complications (Diabetes Survival Skills Education) will improve Outcome: Progressing   Problem: Coping: Goal: Ability to adjust to condition or change in health will improve Outcome: Progressing   Problem: Clinical Measurements: Goal: Respiratory complications will improve Outcome: Progressing

## 2023-08-26 NOTE — Plan of Care (Signed)
  Problem: Coping: Goal: Ability to adjust to condition or change in health will improve Outcome: Progressing   Problem: Fluid Volume: Goal: Ability to maintain a balanced intake and output will improve Outcome: Progressing

## 2023-08-26 NOTE — Evaluation (Signed)
Occupational Therapy Evaluation Patient Details Name: Julia Ingram MRN: [id number] DOB: 1975/12/17 Today's Date: 08/26/2023   History of Present Illness 48 yo female who was admitted on 08/24/2023 due to cough, congestion, SOB, chest discomfort and malaise 3 or 4 days. Pt was found to have flu. Pt PMH includes but is not limited to: asthma, HTN, SCT, CAD, vocal ford dysfunction, MI, pyloric stenosis, tobacco abuse, DM II, chronic heart failure, and medial uniknee arthroplasty.   Clinical Impression   Patient evaluated by Occupational Therapy with no further acute OT needs identified. All education has been completed and the patient has no further questions. Patient is MI in room at this time. Patient endorsed not needing therapy.  See below for any follow-up Occupational Therapy or equipment needs. OT is signing off. Thank you for this referral.        If plan is discharge home, recommend the following:      Functional Status Assessment  Patient has not had a recent decline in their functional status  Equipment Recommendations  None recommended by OT       Precautions / Restrictions Restrictions Weight Bearing Restrictions Per Provider Order: No      Mobility Bed Mobility Overal bed mobility: Modified Independent                  Transfers Overall transfer level: Modified independent                        Balance Overall balance assessment: Modified Independent               ADL either performed or assessed with clinical judgement   ADL Overall ADL's : Modified independent             General ADL Comments: patient was able to complete toileting tasks MI in room. patient completed bed mobility with MI with increased time. patient reported she did not need PT or OT to work with her. she reported plan was to d/c home with son supoprt as needed when medically stable.     Vision   Vision Assessment?: No apparent visual deficits             Pertinent Vitals/Pain Pain Assessment Pain Assessment: No/denies pain     Extremity/Trunk Assessment Upper Extremity Assessment Upper Extremity Assessment: Overall WFL for tasks assessed   Lower Extremity Assessment Lower Extremity Assessment: Overall WFL for tasks assessed   Cervical / Trunk Assessment Cervical / Trunk Assessment: Normal      Cognition Arousal: Alert Behavior During Therapy: WFL for tasks assessed/performed Overall Cognitive Status: Within Functional Limits for tasks assessed           General Comments: patient was oriented reporting that she is at her baseline                Home Living Family/patient expects to be discharged to:: Private residence Living Arrangements: Children Available Help at Discharge: Family;Available PRN/intermittently       Home Equipment: None          Prior Functioning/Environment Prior Level of Function : Independent/Modified Independent               ADLs Comments: working as Electrical engineer. has son who lives with her who can help PRN with IADLs                 OT Goals(Current goals can be found in the care plan section) Acute Rehab OT  Goals OT Goal Formulation: All assessment and education complete, DC therapy  OT Frequency:         AM-PAC OT "6 Clicks" Daily Activity     Outcome Measure Help from another person eating meals?: None Help from another person taking care of personal grooming?: None Help from another person toileting, which includes using toliet, bedpan, or urinal?: None Help from another person bathing (including washing, rinsing, drying)?: None Help from another person to put on and taking off regular upper body clothing?: None Help from another person to put on and taking off regular lower body clothing?: None 6 Click Score: 24   End of Session Nurse Communication: Other (comment) (no therapy needs)  Activity Tolerance: Patient tolerated treatment well Patient left:                      Time: 6578-4696 OT Time Calculation (min): 8 min Charges:  OT General Charges $OT Visit: 1 Visit OT Evaluation $OT Eval Low Complexity: 1 Low  Ernesto Lashway OTR/L, MS Acute Rehabilitation Department Office# [phone]   Selinda Flavin 08/26/2023, 11:11 AM

## 2023-08-30 LAB — CULTURE, BLOOD (ROUTINE X 2)
Culture: NO GROWTH
Culture: NO GROWTH

## 2024-06-29 LAB — AMB RESULTS CONSOLE CBG: Glucose: 131

## 2024-08-12 ENCOUNTER — Ambulatory Visit (INDEPENDENT_AMBULATORY_CARE_PROVIDER_SITE_OTHER): Admitting: Dermatology

## 2024-08-12 ENCOUNTER — Encounter: Payer: Self-pay | Admitting: Dermatology

## 2024-08-12 VITALS — BP 128/85 | Temp 98.1°F

## 2024-08-12 DIAGNOSIS — L918 Other hypertrophic disorders of the skin: Secondary | ICD-10-CM

## 2024-08-12 DIAGNOSIS — L821 Other seborrheic keratosis: Secondary | ICD-10-CM | POA: Diagnosis not present

## 2024-08-12 DIAGNOSIS — L820 Inflamed seborrheic keratosis: Secondary | ICD-10-CM

## 2024-08-12 NOTE — Patient Instructions (Signed)

## 2024-08-12 NOTE — Progress Notes (Signed)
" ° °  New Patient Visit  History of Present Illness Julia Ingram is a 49 year old female who presents with a spot on her scalp.  She has a spot on her scalp that has been present for an unspecified duration. The spot has been growing but is not associated with pain, itching, or bleeding.  She mentions that sometimes when she swims a lot, she could potentially peel it off. She has had previous experience with liquid nitrogen treatment on her toe, which she recalls as being very cold.  Patient states she has spot check located at the scalp that she would like to have examined. Patient reports the areas have been there for 15 years. She reports the areas are not bothersome. She states that the areas have not spread. Patient reports she has not previously been treated for these areas. Patient denies Hx of bx. Patient denies family history of skin cancer(s).  The following portions of the chart were reviewed this encounter and updated as appropriate: medications, allergies, medical history  Review of Systems:  No other skin or systemic complaints except as noted in HPI or Assessment and Plan.  Objective  Well appearing patient in no apparent distress; mood and affect are within normal limits.  A focused examination was performed of the following areas: Scalp   Relevant exam findings are noted in the Assessment and Plan.    Right Temple Inflamed stuck on papule  Assessment & Plan  INFLAMED SEBORRHEIC KERATOSIS Right Temple - Destruction of lesion - Right Temple Complexity: simple   Destruction method: cryotherapy   Informed consent: discussed and consent obtained   Timeout:  patient name, date of birth, surgical site, and procedure verified Lesion destroyed using liquid nitrogen: Yes   Region frozen until ice ball extended beyond lesion: Yes   Outcome: patient tolerated procedure well with no complications   Post-procedure details: wound care instructions given    SKIN TAGS, MULTIPLE  ACQUIRED    SEBORRHEIC KERATOSIS - Stuck-on, waxy, tan-brown papules and/or plaques  - Benign-appearing - Discussed benign etiology and prognosis. - Observe - Call for any changes  Acrochordons (Skin Tags)- Left axillae - Fleshy, skin-colored pedunculated papules - Benign appearing.  - Observe. - If desired, they can be removed with an in office procedure that is not covered by insurance. - Please call the clinic if you notice any new or changing lesions.   Return if symptoms worsen or fail to improve.  I, Doyce Pan, CMA, am acting as scribe for RUFUS CHRISTELLA HOLY, MD.   Documentation: I have reviewed the above documentation for accuracy and completeness, and I agree with the above.  RUFUS CHRISTELLA HOLY, MD     "
